# Patient Record
Sex: Male | Born: 1970 | Race: White | Hispanic: No | Marital: Married
Health system: Midwestern US, Academic
[De-identification: ages and names within clinical notes are randomized; demographics above are authoritative.]

---

## 2017-06-27 ENCOUNTER — Ambulatory Visit: Admit: 2017-06-27 | Discharge: 2017-06-28 | Primary: Neurology

## 2017-06-27 ENCOUNTER — Encounter: Admit: 2017-06-27 | Discharge: 2017-06-27 | Primary: Neurology

## 2017-06-27 DIAGNOSIS — F17200 Nicotine dependence, unspecified, uncomplicated: Principal | ICD-10-CM

## 2017-06-27 DIAGNOSIS — I639 Cerebral infarction, unspecified: ICD-10-CM

## 2017-06-27 NOTE — Progress Notes
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 Last seen 6 months ago at which time we discussed his ongoing recovery from aphasia.  No more SLP but working on language at home.  Had visual field testing and R hom homynopsia is much improved.  Has been driving with wife and doing well.  Some concerns about passing the DMV test based on reading ability.  Will be one year in September.  Will need repeat Neuropsychology testing in the fall.  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         Review of Systems   All other systems reviewed and are negative.      Current Outpatient Prescriptions:   ???  ascorbic acid (VITAMIN C) 500 mg tablet, Take 500 mg by mouth at bedtime daily., Disp: , Rfl:   ???  aspirin 81 mg chewable tablet, Chew 1 tablet by mouth daily. Take with food., Disp: 90 tablet, Rfl: 3  ???  sertraline (ZOLOFT) 25 mg tablet, TAKE 3 TABLETS BY MOUTH DAILY, Disp: 90 tablet, Rfl: 1  ???  simvastatin (ZOCOR) 10 mg tablet, Take 1 tablet by mouth at bedtime daily., Disp: 60 tablet, Rfl: 0  ???  vitamins, multiple tablet, Take 1 tablet by mouth at bedtime daily., Disp: , Rfl:      No Known Allergies     Physical Exam  Vitals:    06/27/17 1507   BP: 129/87   Pulse: 87   SpO2: 97%     Oswestry Total Score:: (P) 0  Pain Score: Zero  There is no height or weight on file to calculate BMI.    Neurologic Exam???  ???  Mental Status???  Speech: Expressive aphasia, nonfluent but much improved since last visit, able to comprehend complex commands and basic conversation, able to copy a sentence but cannot read it, cannot read his name but can tell me his name  Level of consciousness: alert  ??? Cranial Nerves???  Cranial nerves II through XII intact.   ???  Motor Exam???  Overall muscle tone: normal  ???  Strength   Strength 5/5 throughout.   ???  Sensory Exam???  Light touch normal.   ???  Gait, Coordination, and Reflexes???  ???  Gait  Gait: normal  ???  Coordination???  Heel to shin coordination: normal  ???  Reflexes   Right biceps: 3+  Left biceps: 3+  Right triceps: 3+  Left triceps: 3+  Right patellar: 3+  Left patellar: 3+  Right achilles: 3+  Left achilles: 3+  Right ankle clonus: absent  Left ankle clonus: absent         IMPRESSION:  1. Expressive aphasia    2. Acute ischemic left MCA stroke (HCC)    3. Impaired mobility and ADLs    4. 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 Therapies: completed outpatient SLP, continue HEP with friend who helps with letters, reading, and writing.    2. Cognition: Neuropsychology testing completed last year, will need to repeat this year about 1 year post-stroke (September).  3. Vision: Neuroopthalmology clinic established.  Visual fields improved with recent testing, will scan into records (small R quadrantopsia).  4. Driving: returned to driving  5. 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 Stroke resources:                 - gave information about aphasia research options at [facility] through the SLP department and at [employer] and [facility]   - gave information about local and national aphasia resources handout    RTC 3 mo to f/u on neuropscyh testing

## 2017-06-28 DIAGNOSIS — Z7409 Other reduced mobility: ICD-10-CM

## 2017-06-28 DIAGNOSIS — R480 Dyslexia and alexia: ICD-10-CM

## 2017-06-28 DIAGNOSIS — I63512 Cerebral infarction due to unspecified occlusion or stenosis of left middle cerebral artery: ICD-10-CM

## 2017-06-28 DIAGNOSIS — R4701 Aphasia: Principal | ICD-10-CM

## 2017-10-03 ENCOUNTER — Encounter: Admit: 2017-10-03 | Discharge: 2017-10-03 | Primary: Neurology

## 2017-10-03 ENCOUNTER — Ambulatory Visit: Admit: 2017-10-03 | Discharge: 2017-10-04 | Primary: Neurology

## 2017-10-03 DIAGNOSIS — I639 Cerebral infarction, unspecified: ICD-10-CM

## 2017-10-03 DIAGNOSIS — I69390 Apraxia following cerebral infarction: ICD-10-CM

## 2017-10-03 DIAGNOSIS — R4701 Aphasia: ICD-10-CM

## 2017-10-03 DIAGNOSIS — F17200 Nicotine dependence, unspecified, uncomplicated: Principal | ICD-10-CM

## 2017-10-03 NOTE — Progress Notes
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  Now one year post stroke in September.  Last seen 3 months ago at which time we discussed his ongoing recovery from aphasia and return to driving, activities.  Still working on language at home and very independent for physical activity including other people - driving, hauling, cutting down trees.  Had visual field testing and R hom homynopsia is much improved, has small quandrantopsia.  Has been driving with wife and doing well.  Will need repeat Neuropsychology testing in the fall, but have to reschedule based on recent hospitalization and cost.  Does report some dizziness that seems to come in spells a few days a week.  Not sure if lightheadedness or true vertigo.  Associated with a feeling of flushing and warmness, working outside and dehydration.  Has been told he has low blood pressure at recent hospitalization and other clinic visits.         Review of Systems   Respiratory: Positive for apnea.    Neurological: Positive for seizures and numbness (right leg-since the stroke).   All other systems reviewed and are negative.      Current Outpatient Prescriptions:   ???  ascorbic acid (VITAMIN C) 500 mg tablet, Take 500 mg by mouth at bedtime daily., Disp: , Rfl:   ???  aspirin 81 mg chewable tablet, Chew 1 tablet by mouth daily. Take with food., Disp: 90 tablet, Rfl: 3  ???  sertraline (ZOLOFT) 25 mg tablet, TAKE 3 TABLETS BY MOUTH DAILY, Disp: 90 tablet, Rfl: 1  ???  simvastatin (ZOCOR) 10 mg tablet, Take 1 tablet by mouth at bedtime daily., Disp: 60 tablet, Rfl: 0  ???  vitamins, multiple tablet, Take 1 tablet by mouth at bedtime daily., Disp: , Rfl:      No Known Allergies     Physical Exam  Vitals: 10/03/17 1404   BP: (!) 130/92   Pulse: 74   Resp: 16   Temp: 37.2 ???C (98.9 ???F)   TempSrc: Oral   SpO2: 98%   Weight: 109.8 kg (242 lb 1.9 oz)   Height: 198.1 cm (78)     Oswestry Total Score:: 0  Pain Score: Zero  Body mass index is 27.98 kg/m???.    Neurologic Exam???  ???  Mental Status???  Speech: Expressive aphasia, fluency much improved since last visit  Level of consciousness: alert  ???  Cranial Nerves???  Cranial nerves II through XII intact.  No nystagmus, abnormal saccads, not able to ellicit any vertigo   ???  Motor Exam???  Overall muscle tone: normal  ???  Strength   Strength 5/5 throughout.   ???  Sensory Exam???  Light touch normal.   ???  Gait, Coordination, and Reflexes???  ???  Gait  Gait: normal including tandem gait  ???  Coordination???  Heel to shin coordination: normal, negative Rhomberg test  ???  Reflexes   Right biceps: 3+  Left biceps: 3+  Right triceps: 3+  Left triceps: 3+  Right patellar: 3+  Left patellar: 3+  Right achilles: 3+  Left achilles: 3+  Right ankle clonus: absent  Left ankle clonus: absent         IMPRESSION:  1. Acute ischemic left MCA stroke (HCC)    2. Expressive aphasia  3. 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 Right homonymous hemianopsia          PLAN:    1. Therapies: completed outpatient SLP, continue HEP for speech fluency, reading, and writing. ???  2. Cognition: Neuropsychology testing completed last year, they would like to hold off on repeat for now  3. Vision: Neuroopthalmology clinic established.  Visual fields improved with recent testing.  4. Driving: returned to driving  5. 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 Stroke resources: ???gave information about aphasia research options at [facility] through the SLP department and at [facility] and [place], gave information about local and national aphasia resources handout  7. Post-stroke seizure: recommend that they establish care with a neurologist for recent seizure, they would prefer to get a referral from PCP nearer home  8. Dizziness/lightheadedness:  seems more consistent with pre-syncope or orthostasis rather than true vertigo, advised to keep hydrated, substitute gatorade or poweraid for water during strenuous activity, f/u PCP for blood pressure management.  ???  RTC prn future stroke rehabilitation needs

## 2017-10-04 DIAGNOSIS — I63512 Cerebral infarction due to unspecified occlusion or stenosis of left middle cerebral artery: Principal | ICD-10-CM

## 2023-01-30 ENCOUNTER — Encounter: Admit: 2023-01-30 | Discharge: 2023-01-30 | Primary: Neurology

## 2023-02-13 ENCOUNTER — Encounter: Admit: 2023-02-13 | Discharge: 2023-02-13 | Payer: MEDICARE | Primary: Neurology

## 2023-02-13 NOTE — Telephone Encounter
Patient contacted for pre-visit information gathering r/t referral for cognitive concerns and impaired gait (slumping to the right with ambulation).  This patient was hospitalized for stroke here at [facility] back in 2017 and his PCP wants him to be seen again to check on things and establish with neurolog  Referring provider: Jeanella Craze PA-C    Previous neurologist: [facility]     Imaging: CT Head 01/23/23        Recent lab work: wife reports trying to obtain this week        Recent ED visits (last 6 months) r/t chief complaint: No        Confirmed appointment for 02/18/23 at 10:15 am.  Also confirmed that patient aware of clinic location.

## 2023-02-18 ENCOUNTER — Ambulatory Visit: Admit: 2023-02-18 | Discharge: 2023-02-18 | Payer: MEDICARE | Primary: Neurology

## 2023-02-18 ENCOUNTER — Encounter: Admit: 2023-02-18 | Discharge: 2023-02-18 | Payer: MEDICARE | Primary: Neurology

## 2023-02-18 DIAGNOSIS — F17200 Nicotine dependence, unspecified, uncomplicated: Secondary | ICD-10-CM

## 2023-02-18 DIAGNOSIS — R519 Generalized headaches: Secondary | ICD-10-CM

## 2023-02-18 DIAGNOSIS — H547 Unspecified visual loss: Secondary | ICD-10-CM

## 2023-02-18 DIAGNOSIS — G4730 Sleep apnea, unspecified: Secondary | ICD-10-CM

## 2023-02-18 DIAGNOSIS — R413 Other amnesia: Secondary | ICD-10-CM

## 2023-02-18 DIAGNOSIS — F79 Unspecified intellectual disabilities: Secondary | ICD-10-CM

## 2023-02-18 DIAGNOSIS — R7309 Other abnormal glucose: Secondary | ICD-10-CM

## 2023-02-18 DIAGNOSIS — R569 Unspecified convulsions: Secondary | ICD-10-CM

## 2023-02-18 DIAGNOSIS — R4182 Altered mental status, unspecified: Secondary | ICD-10-CM

## 2023-02-18 DIAGNOSIS — I639 Cerebral infarction, unspecified: Secondary | ICD-10-CM

## 2023-02-18 DIAGNOSIS — G4733 Obstructive sleep apnea (adult) (pediatric): Secondary | ICD-10-CM

## 2023-02-18 DIAGNOSIS — I6340 Cerebral infarction due to embolism of unspecified cerebral artery: Secondary | ICD-10-CM

## 2023-02-18 DIAGNOSIS — R4189 Other symptoms and signs involving cognitive functions and awareness: Secondary | ICD-10-CM

## 2023-02-18 DIAGNOSIS — M1990 Unspecified osteoarthritis, unspecified site: Secondary | ICD-10-CM

## 2023-02-18 NOTE — Progress Notes
Date of Service: 02/18/2023    Subjective:             Gregory Gray is a 52 y.o. 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            History of Present Illness  HPI  Gregory Gray initially presented in 08/2016.  At that time he presented with sudden onset aphasia and R sided hemiparesis.  He was seen at OSH and had an NIH of 20 and was given tPA and transferred for possible EVT.   Upon arrival NIH 10.  CTA with mild to distal L M2/M3 occlusion with significant penumbra.  He went to IR, EVT was attempted but TICI 0.    Etiology of stroke remains cryptogenic.  HE had an TTE which showed EF of 65%, normal ventricular size and function, normal atrial size, no shunt or significant valvular abnormalities.  He underwent TEE which was unremarkable.  29 day event monitor was obtained but only showed a single baseline transmission on September 20th which demonstrated sinus rhythm, raising question if monitor was defective.   CT C/A/P was negative for malignancy.  Hypercoag workup was obtained.  Initial cardiolipin IgM was elevated at 18.1, but IgG was normal at 3.6.  Hex Lupus anticoagulant was elevated at 13.   DRVV was normal.  He had normal Beta 2 GP IgM/IgG, protein C/S, Factor V Leiden, activated protein C resistance, ESR, CRP.  A1c was a 5.6%, LDL at 85.  HE was started on ASA and statin.  He was discharged to IPR.  HE had followup in Neurology clinic in 12/2016 and LINQ device was ordered.   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  He was evaluated with neuropsych testing in 2017 and had scored 16/20 on simple items and 14/20 on complex items on boston diagnostic aphasia exam.  Further testing showed mild anxiety and no significant depressive symptomology.  HE was taking sertraline.  He had followup which showed improved scoring on his BDAE and CLOX showing improved recovery.  Further testing with WASI-II IQ score was 49m limited due to aphasia, perceptual reasoning was superior to verbal comprehension.  He had difficulties with vocabulary, verbal fluency, and naming.  Delayed memory was mildly impaired and visual motor speed was also mildly impaired.  IT was recommended he continue therapies.      Recent events:    Since his last visit in 2018 from a stroke standpoint he is overall doing well.   He as not had any further stroke like events.   He still has ongoing expressive speech difficulties and was in speech therapy but found that he was not advancing much more and has now stopped.  He was doing home exercises with home app but this has not helped much lately either.    He had a follow-up with his primary care doctor and has routine labs checked yearly.   He is still taking ASA 81mg , amlodipine 5mg , and crestor 5mg  for HLD.   SO far no etiology for his stroke has been found.  Family reports that he has significant family history of Factor V leiden and multiple family members have had blood clots/Pes.  His factor V leiden was negative.     Over the past few  months he has noticed worsening headaches.  This were worst 2-3 months ago, and recently have gotten better.  2-3 months ago he was having headaches daily, usually in the evenings until he went to bed.  Headaches were bitemporal and throbbing in nature.  He has photophobia and phonophobia.  No known vision changes, vomiting, or waking him up from sleep.  He reports his sleep is on and off.  He has OSA but does not always remember to wear his mask.  He does have periods of apnea and snoring per family.  He has not seen Sleep Medicine since his stroke in 2018.    Otherwise, he stays active working around the farm.  On hot days, he does get overheated and can get b/l UE cramps and stiffening.  He does not lose awareness or have abnormal shaking with these events. These events improve with rest and hydration.   He otherwise eats well and tries to stay hydrated.    Family has recently noticed more difficulties with his memory over the past year.  He is having more difficulty with both remote and recent memory.  Remote memory he can forget people he once knew or gets mixed up on childhood stories/events he once new well.  He also has difficulties with recent memory such as remembering important dates or appointments.  Sometimes he will forget to complete tasks such as doing dishes when he used to do them prior.  Denies getting lost in familiar environments but can get confused in unfamiliar environments.  NO concerns for leaving stove or water on.   Denies any hallucinations or changes in behavior, but family does notice he is more short tempered recently.   He was previously on fluoxetine for his stroke recovery and stroke related anxiety but has since stopped taking, he does not wish to resume taking at this time.     Due to memory difficulties and headache, repeat CT head was obtained in 01/2023 which showed stable LMCA stroke findings with encephalomalacia and ex vacuo dilation of lateral ventricle.   He was referred to [facility] for followup of these above issues.              Imaging/diagnostics:(Reviewed)  -9/ 2017: CTA head:   1.  Left distal M2/M3 arterial occlusion with associated hyperdense vessel   on noncontrast imaging.   2.  Paucity of distal arterial opacification in the left anterior lateral   frontal lobe without corresponding arterial occlusion identified.   3.  No obvious loss of gray-white differentiation or intracranial   hemorrhage.     -08/2016: CTA neck:   1.  Widely patent extracranial carotid and vertebral arteries.   2.  Ill-defined linear hypodensity in the proximal left internal carotid   artery favored for flow artifact. Carotid web is thought to be less   likely.     -08/2016: CT perfusion:   Small area of completed infarct within the posterior left MCA distribution   and small area in the left anterior medial frontal lobe deep white matter.   There are larger areas of parenchyma surrounding these infarcts with   decreased cerebral blood flow but preserved cerebral blood volume   suggesting tissue at risk.     -IR Arteriogram  1. Acute occlusion of mid left M2 segment involving a dominant superior   division of the MCA.   2. Unsuccessful mechanical thrombectomy. No recanalization was achieved.   (TICI 0)     -08/2016 MRI Head w/out  Interval expected evolution of moderate to large left MCA distribution   infarct with mild regional mass effect. No evidence of hemorrhagic   transformation or hydrocephalus.     -01/2023: CT Head /wout:  Unchanged prior L parietal MCA infarct with ex vacuo dilation of the posterior horn of L lateral ventricle.  No abnormal enhancement or ICH.  (Reviewed)    -08/2016 CT C/A/P  CHEST:   Normal CT of the chest.     ABDOMEN AND PELVIS:     1.  No abdominopelvic mass or lymphadenopathy.   2.  Right iliac fossa stranding likely from recent arteriogram. No   well-defined hematoma or fluid collection.       TTE 08/2016  Interpretation Summary    The left ventricle is normal in size and function, estimated ejection fraction is 65%.  The right ventricle is normal in size and function.  The left and right atria are normal in size. Agitated saline contrast injection with and without Valsalva demonstrates no evidence of right to left interatrial shunt.  No significant valve disease is identified.  Inadequate tricuspid regurgitation signal, unable to accurately estimate PA systolic pressure with this study.  No gross mass, thrombus or vegetation is identified.      No prior studies are available for comparison      TEE 08/2016  Interpretation Summary    Normal LV size, wall motion with EF =65%.   Normal RV size and function.   No evidence of intracardiac shunt with on color flow or agitated normal saline (bubble study)      2017:  Event monitor:  A looping event recorder was utilized for 29 days there is a single baseline transmission on September 20 which demonstrated sinus rhythm.  There are no additional transmissions.    2017: Diagnostic labs and hypercoag:  Initial cardiolipin IgM was elevated at 18.1, but IgG was normal at 3.6.  Hex Lupus anticoagulant was elevated at 13.   DRVV was normal.  He had normal Beta 2 GP IgM/IgG, protein C/S, Factor V Leiden, activated protein C resistance, ESR, CRP.  A1c was a 5.6%, LDL at 85.          Review of Systems   Respiratory:  Positive for apnea and cough.    Musculoskeletal:  Positive for arthralgias.   Allergic/Immunologic: Positive for environmental allergies.   Neurological:  Positive for seizures, speech difficulty and headaches.   Psychiatric/Behavioral:  Positive for agitation, confusion, decreased concentration and sleep disturbance.    All other systems reviewed and are negative.      Objective:          amLODIPine (NORVASC) 5 mg tablet Take one tablet by mouth daily.    ascorbic acid (VITAMIN C) 500 mg tablet Take one tablet by mouth at bedtime daily.    aspirin 81 mg chewable tablet Chew 1 tablet by mouth daily. Take with food.    rosuvastatin (CRESTOR) 5 mg tablet Take one tablet by mouth daily.    vitamins, multiple tablet Take one tablet by mouth at bedtime daily.     There were no vitals filed for this visit.  There is no height or weight on file to calculate BMI.     Physical Exam        Neuro exam:   Mental status: alert,  oriented to place and time, but some difficulty due to aphasia   Speech:    Normal Abnormal   Fluency  Expressive aphasia   Comprehension  Some difficulty with following complex commands    Articulation x                  Cranial Nerves:    Normal Abnormal   II PERRL, no visual fields deficits    III, IV, VI EOMI    V Sensation nml V1-V3    VII    Normal facial symmetry    VIII Intact to voice    IX, X Palate even    XI Equal shoulder shrug    XII Tongue midline Muscle/motor:   Tone: nml  Bulk: nml       NF NE SA EF EE WE WF FF FE FA TA HF HA HE KF KE DF PF In Ev TF TE   R   5 5 5 5 5 5    5   5 5 5 5        L   5 5 5 5 5 5    5   5 5 5 5            Sensation:    Normal RUE LUE RLE LLE   Light Touch x       Pin Prick        Temperature        Vibration        Proprioception            Coordination:    Normal Abnormal Right Abnormal Left   Finger to Nose x     Rapid alternating       Heel to Shin      Finger tap      Foot tap      Other        Gait and Station:  Regular gait: nml            Assessment and Plan:  Gregory Gray is a 52 y.o. male h/o LMCA stroke s/p tPA and EVT TICI 0 in 08/2016 of undetermined etiology,  OSA, HLD, smoking  that presents for follow up of his stroke and for memory difficulties and headaches.       From a stroke standpoint he is overall stable.  Reviewed last CT in 01/2023, shows stable chronic changes since last MRI with encephalomalacia and ex-vacuo dilation of the posterior L lateral ventricle.  No new findings.    Overall etiology of his stroke is unknown, and remains cryptogenic at this time.   He did have an event monitor, but was faulty as monitor only transmitted one recording although he wore it for the month.  Will plan to repeat.  Also on review of his prior labs, he had borderline elevated Cardiolipin IgM and Hex lupus, will plan to repeat hypercoags today.  He also has strong family history of clots/Pes and family history of factor V leiden, although testing was negative for him.   Will send to Hematology also for evaluation given cryptogenic stroke and family history.   Otherwise continue secondary stroke risk factor modifications as below with ASA, statin, HTN management.     Episodes of UE Cramping:  Semiology of events with retained awareness is not consistent with epileptic seizures.  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       Cognitive dysfunction:  1 year history of worsening remote and recent memory per family.  MOCA scoring today  is 19/30, but limited due to aphasia.   Suspect cognitive decline likely related to sequale of stroke.  Will send additional testing B12/Folate, RPR, TSH to assess for reversible causes.  He has had neuro psych testing after his stroke as above, but has not followed up since about 2018.   Referral to Memory Clinic for formal testing.      Headaches: Suspect migraine without aura.   Improved recently over past few months.  Will plan to repeat MRI to ensure no other causes, although suspicion is low.   Has OSA which may be contributing due to reported apnea/snoring and he does forget to wear his CPAP at night.  Since it has been a number of years since his last eval with Sleep Medicine, will refer back for evaluation.             LMCA stroke s/p tPA and EVT TICI 08/2016, etiology, cryptogenic  -Continue ASA 81mg  daily  -Prior TTE/TEE unremarkable.   EF of 65%, normal ventricular size and function, normal atrial size, no shunt or significant valvular abnormalities.    -Had prior event monitor, but only single recording was sent, concerning for faulty monitor.  Will repeat 30 day MCOT.   -HLD: Goal LDL <70.  Continue crestor 5mg .  Repeat lipid panel.   -A1c goal <7.0.  Repeat A1c  -HTN: BP today 134/88.  Goal BP <130/80.  Continue amlodipine 5mg  daily.    -Borderline hypercoag testing:  2017:  Initial cardiolipin IgM was elevated at 18.1, but IgG was normal at 3.6.  Hex Lupus anticoagulant was elevated at 13.   DRVV was normal.  He had normal Beta 2 GP IgM/IgG, protein C/S, Factor V Leiden, activated protein C resistance, ESR, CRP.   Will plan to repeat Cardiolipin, Hex Lupus AC, Beta 2 GP, DRVV today.   Will also send referral to Hematology given strong family history of clots/PES (has Factor V leiden, but he was negative).    Cognitive dysfunction  -MOCA scoring today, mostly limited due to aphasia score 19/30  -B12/Folate, RPR, TSH reflex T4  -Referral to Memory Clinic     Headaches, Possible Migraine w/out aura  -Start Magnesium Oxide 400mg  daily.  Does not wish to do other pharmacological meds at this time.   -Will also repeat MRI and refer to Sleep Medicine to see if sleep apnea could be contributing  -Lifestyle modifications discussed    Episodes of UE Cramping  -Semiology of events with retained awareness is not consistent with epileptic seizures.  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    Cherlynn Kaiser, DO  Vascular Neurology       Time Spent 78 minutes.

## 2023-02-18 NOTE — Patient Instructions
For your headaches we will do a repeat MRI  We recommend to start Magnesium Oxide 400mg  daily this can be found over the counter   We will refer you to Sleep Medicine for follow up of your sleep    We will plan to repeat an heart monitor today and repeat labs for your stroke and memory  We will refer you for memory testing at our [facility]

## 2023-02-20 ENCOUNTER — Encounter: Admit: 2023-02-20 | Discharge: 2023-02-20 | Payer: MEDICARE | Primary: Neurology

## 2023-02-20 ENCOUNTER — Ambulatory Visit: Admit: 2023-02-20 | Discharge: 2023-02-20 | Payer: MEDICARE | Primary: Neurology

## 2023-02-20 DIAGNOSIS — I6340 Cerebral infarction due to embolism of unspecified cerebral artery: Secondary | ICD-10-CM

## 2023-02-20 NOTE — Progress Notes
To our valued patient,     We have enrolled your heart monitor and requested it to be mailed to your home.  You should receive this within 2-3 business days. Please wear the monitor for 30 days. When you have completed the study, please remove the device, and mail it back to the company. Please call Customer Service at [phone] (option 1, 1, 1) with questions about placement, troubleshooting, and insurance coverage. You can reach the ambulatory heart monitor team at [phone].        Please write your NAME, PHYSICIAN, START DATE/TIME on the diary.    Prep skin by shaving and ensuring there is no lotion on the chest.  Gently abrade for clear ecgs.  Showering is okay, but best to shower with back to the water.    Please use the diary for symptoms - specific date and times and what you are feeling.  Please return the device in the mailer with diary promptly after completing the study.        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)  Ordering Provider: [name], DO  AMB Monitor Serial Number: home  No data recorded    Start Time and Date: 02/20/23 8:26 AM   Patient Name: Gregory Gray  DOB: 06/02/1971 09/12/70  MRN: [id number]  Sex: male  Mobile Phone Number: [phone] (mobile)  Home Phone Number: [phone]  Patient Address: [phone] [name] [phone]  Insurance Coverage: Francine Graven CHOICE PPO  Insurance ID: [id number]  Insurance Group #: [id number]  Insurance Subscriber: Basich,Zohar  Implanted Cardiac Device Information: No results found for: EPDEVTYP      Patient instructed to contact company phone number on the monitor box with questions regarding billing, placement, troubleshooting.     Dorena Dew    ____________________________________________________________    Clinic Staff:    Complete additional steps for documentation double check/Co-Sign.  In Follow-up, send chart upon closing encounter to [facility] HRM AMBULATORY MONITORS    HRM Ambulatory Monitoring Team:  Schedule on appropriate template and check-in.   Clinic Placement Schedule on clinic location [facility] schedule   Home Enrollment Schedule on Home Enrollment schedule ([facility] RHYTHM)   Given to patient in clinic for self-placement Schedule on Home Enrollment schedule ([facility] RHYTHM)   Inpatient Schedule on [place] [facility] AMBULATORY MONITORING template   2. Please enroll with appropriate vendor.

## 2023-02-24 ENCOUNTER — Encounter: Admit: 2023-02-24 | Discharge: 2023-02-24 | Payer: MEDICARE | Primary: Neurology

## 2023-03-06 ENCOUNTER — Encounter: Admit: 2023-03-06 | Discharge: 2023-03-06 | Payer: MEDICARE | Primary: Neurology

## 2023-03-08 ENCOUNTER — Encounter: Admit: 2023-03-08 | Discharge: 2023-03-08 | Payer: MEDICARE | Primary: Neurology

## 2023-03-11 ENCOUNTER — Encounter: Admit: 2023-03-11 | Discharge: 2023-03-11 | Payer: MEDICARE | Primary: Neurology

## 2023-03-11 DIAGNOSIS — I639 Cerebral infarction, unspecified: Secondary | ICD-10-CM

## 2023-03-11 NOTE — Telephone Encounter
Encounter for order for MRI

## 2023-04-14 ENCOUNTER — Encounter: Admit: 2023-04-14 | Discharge: 2023-04-14 | Payer: MEDICARE | Primary: Neurology

## 2023-04-14 DIAGNOSIS — I639 Cerebral infarction, unspecified: Secondary | ICD-10-CM

## 2023-04-14 NOTE — Telephone Encounter
Called to report results of event monitor.  Event monitor was unremarkable.  Wife reports that lab does not have orders, will place new orders as previously recommended in clinic.     Cherlynn Kaiser, DO  Vascular Neurology       Event Monitor    Result Text  30-day event monitor reviewed for history of CVA     1.  Baseline sinus rhythm with average heart rate of 78 bpm.  Range between 51 to 147 bpm  2.  No evidence of atrial fibrillation noted  3.  Patient symptoms of lightheadedness tiredness and fatigue do not correlate with any arrhythmias.  4.  No other evidence of any arrhythmias noted        Overall benign event monitor

## 2023-04-23 ENCOUNTER — Ambulatory Visit: Admit: 2023-04-23 | Discharge: 2023-04-23 | Payer: MEDICARE | Primary: Neurology

## 2023-04-23 ENCOUNTER — Encounter: Admit: 2023-04-23 | Discharge: 2023-04-23 | Payer: MEDICARE | Primary: Neurology

## 2023-04-23 DIAGNOSIS — I639 Cerebral infarction, unspecified: Secondary | ICD-10-CM

## 2023-04-23 MED ORDER — GADOBENATE DIMEGLUMINE 529 MG/ML (0.1MMOL/0.2ML) IV SOLN
20 mL | Freq: Once | INTRAVENOUS | 0 refills | Status: CP
Start: 2023-04-23 — End: ?
  Administered 2023-04-23: 13:00:00 20 mL via INTRAVENOUS

## 2023-04-24 ENCOUNTER — Encounter: Admit: 2023-04-24 | Discharge: 2023-04-24 | Payer: MEDICARE | Primary: Neurology

## 2023-04-24 NOTE — Telephone Encounter
Called and relayed results of MRI to wife Marylene Land.   MRI shows old known stroke.  MRI also shows 1cm small ovoid enhancing  extraaxial lesion, most likely a meningioma, less likely aneurysm.  Discussed further imaging with CTA/MRA and follow-up imaging to ensure stability with repeat MRI in ~6-12 months.    Marylene Land will talk with Willi and will wait to next appointment to discuss results and timing of repeating imaging.       Cherlynn Kaiser, DO  Vascular Neurology

## 2023-05-18 ENCOUNTER — Encounter: Admit: 2023-05-18 | Discharge: 2023-05-18 | Payer: MEDICARE | Primary: Neurology

## 2023-05-18 NOTE — Progress Notes
PRE-VISIT PLANNING    Appointment Date/Time: June 17th at 9:20 am  Provider: Lenice Llamas ARPN-NP      PCP Clinical Notes: Yes    Referral Clinical Notes: Yes    Where were they referred from? Dr. Gaylyn Lambert    Called patient/caregiver?         Patient's primary language? English     Do they have another neurologist, if so who?     Additional clinical notes requested? No      Imaging: (CT head, MRI head/brain, FDG-PET within the past 5 years): Yes    What images have they had? CT  head and MRI Head    Location and Dates of imaging: 01/23/2023 and 04/23/2023    Images requested? No          Dates requested:     Reports requested? No        Dates requested:     Images and reports received? Yes All imaging and reports are in O2      Recent Labs:    TSH   No    Vit B12  No    Lab results requested:  No THERE ARE ORDERS IN O2, JUST NOT DONE      Neuropsych/Cognitive testing: Yes    If yes, where was testing done?     Reports requested? No IN O2 FROM 2017    Reports received? Yes    Date Neuropsychology Testing completed: 2017 WITH DR. MONICA KURYLO        Notes: All records are in O2, if there is something not in the chart I will request at the time of office visit.    Adella Hare MA

## 2023-05-19 NOTE — Progress Notes
MEMORY CARE CLINIC EVALUATION    Referring Provider:    Emi Belfast, MD  [address]  [place],  [place] [address]      Chief Complaint:  Gregory Gray is a 52 y.o. old male here for a cognitive evaluation.   Onset:  2022  Course: slowly progressive    This patient is accompanied in the office by his wife, Gregory Gray who contributes to the history.   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 Struggles to read and write after his stroke.     His wife has noticed within the last 2 years he has been having more difficulty with his memory. He is repeating questions and statements. He is forgetting recent conversations. He will talk with his wife and come back 10 min later and talk about the same thing and not realize they already had the conversation. He is driving less, only staying in familiar areas. He used to go grocery shopping, doesn't feel comfortable going to get items. Prefers to keep a routine, will get upset if routine changes.    Social Hx:  Denies history of alcohol or illicit drug abuse.    Relevant Family Hx: Grandfather with dementia     Functional History:  Living situation: Lives with wife  Eating: Independent  Toileting: Independent  Bathing: Independent  Dressing: Independent  Meal Preparation: Independent, can make a simple meal. Wife is the main cook  Usual household chores: Independent, Field seismologist  Medication administration: Dependent, wife sets them up in a pillbox  Managing finances: Dependent, wife took over after his stroke. He would struggle to calculate a tip or make change  Activity: Gardens, woodworking     ROS  Behavioral and Neuropsychiatric History:   PHQ-2 Score: 0 (05/22/2023  9:31 AM)    Depression: no  Anxiety: no  Agitation/Behavior/Personality changes: Increased agitation and frustration    Tremors: no    Gait Changes/Falls: R leg weakness from previous stroke, stumbles occasionally, more stooped posture    Vision/Hearing  problems: no  Speech changes: expressive aphasia    Wt loss: no  Swallowing difficulties: no    Sleep issues: Difficulty with sleep initiation or maintenance. Prefers to keep tv on when sleeping. Has sleep apnea, occasionally wears CPAP. No known RBD  B/B Incontinence: no    Fluctuations in cognition (throughout the day): Yes   Excessive daytime sleepiness: Yes   Loss of smell: No   Repeated visual hallucinations: No   REM sleep behavior disorder: No   Parkinsonism: No   Orthostatic instability: No   Constipation: No   Rhinorrhea:No     Behavioral disinhibition: Yes  - loss of filter when out in public  Socially inappropriate behavior No  Loss of manners or decorum No  Impulsive, rash, or careless actions No  Early apathy or inertia: No  Early Loss of sympathy/empathy: No  Early perseverative, stereotyped, or compulsive/ritualistic behavior: No  Hyperorality and dietary changes: No  Neuropsychological profile - executive dysfunction with relative sparing of memory and visuospatial functions: No    PMH  Vascular disease (CVA, CAD): Yes   Head trauma/TBI: No   Delirium (illness/hospitalizations): No  Vitamin B12 deficiency: No  Cancer: No  Did you receive chemotherapy? No  Major surgeries since memory changes began? No  Thyroid disease: No  Seizures: Yes - seizure in 2018 on a hot day, no seizure since  OSA: Yes wears CPAP    Past Medical History:   Diagnosis Date    Altered mental status 2017    Stroke    Arthritis 2022    Normal wear and tear on the body    Disorganized thinking 2017    Stroke    Generalized headaches 2017    After stroke    Intellectual disability 2017    Stroke    Memory loss 2023    Spouse noticed, patient denies    Seizures (HCC) 2018    After stroke    Sleep apnea 2010    Sleep study    Smoker     Stroke [facility])     Vision decreased 2017 Normal progression       History reviewed. No pertinent surgical history.    Social History     Socioeconomic History    Marital status: Married   Tobacco Use    Smoking status: Every Day     Types: Cigars     Passive exposure: Past (Wife)    Smokeless tobacco: Never    Tobacco comments:     Quit after stroke 2018,  started again in 2022 and current 05-22-23.   Substance and Sexual Activity    Alcohol use: Yes     Comment: Occasionally a beer 05-22-23.    Drug use: Never    Sexual activity: Yes     Partners: Female     Birth control/protection: None       Care Partner Burden and Social Support:  Gregory Gray does partially rely on care giving.  Care partner burden expressed? Yes    Safety Assessment:  Driving Yes - staying in more familiar areas and routine     Disorientation when driving No     Recent traffic violations, accidents, vehicle damage No     Passenger concerns No  Safety concerns in home (fall risk, wandering etc.) No  Accessibility to firearms Yes - locked    Advanced Care Planning  DPOA:  Yes   Advance Directive? Yes    Medications   amLODIPine (NORVASC) 5 mg tablet Take one tablet by mouth daily.    ascorbic acid (VITAMIN C) 500 mg tablet Take one tablet by mouth at bedtime daily.    aspirin 81 mg chewable tablet Chew 1 tablet by mouth daily. Take with food.    rosuvastatin (CRESTOR) 5 mg tablet Take one tablet by mouth daily.    vitamins, multiple tablet Take one tablet by mouth at bedtime daily.       Medication Review:   I have personally reviewed the medication list and am making recommendations as listed in the plan below.   Wife Gregory Gray manages medication at home for Gregory Gray.   The following medications have been identified as potential high risk medications: none      Physical/Cognitive Exam  BP (!) 142/85 (BP Source: Arm, Left Upper, Patient Position: Sitting)  - Pulse 74  - Temp 36.6 ?C (97.8 ?F) (Oral)  - Resp 16  - Ht 198.1 cm (6' 6)  - Wt 99.8 kg (220 lb)  - SpO2 96%  - BMI 25.42 kg/m?   BP Readings from Last 3 Encounters:   05/22/23 (!) 142/85   02/18/23 134/88   10/03/17 (!) 130/92     Wt Readings from Last 6 Encounters:   05/22/23 99.8 kg (220 lb)   02/18/23 101.1 kg (222 lb 12.8 oz)   10/03/17 109.8 kg (242 lb  1.9 oz)   12/27/16 106.1 kg (234 lb)   12/27/16 106.1 kg (234 lb)   10/18/16 102 kg (224 lb 13.9 oz)       General  Groomed with appropriate hygiene. Dress appropriate for season/situation. No assist device    HEENT  NC/AT    Mental Status  Alert  Language fluent    Pulm/CV  Non labored, good effort    Cranial Nerves  Extraocular movements intact  Pupils equal and reactive to light  No facial asymmetry  Tongue midline  All other CNs normal    Motor  Full strength in the upper and lower extremities  Rigidity: None  Bradykinesia: None  Tremor: None  Romberg:    Reflexes  Symmetric, no evidence of hyperreflexia in the biceps, brachioradialis and knees     Coordination  Finger to nose testing without dysmetria bil  Rapid alternating movements normal bil  Toe Tap normal bil    Gait  Rising from chair: Normal - without pushing off  Posture: Normal  Gait initiation: Normal  Stride Length: Normal  Arm Swing: Normal  Turns: Normal    Psych  Affect and mood congruent and appropriate to situation  Thought process is logical  Anosognosia No     Neurobehavioral Testing:  02/2023 [facility] 19/30  05/22/2023 [facility]: 20/38     Neuropsychology Summary and Impression (refer to Neuropsychologist progress note for complete documentation of tests performed and results):  11/2016 Neuropsych Evaluation Dr. Rudi Heap  Impressions:  This neuropsychological evaluation revealed that the patient continues to have expressive and receptive aphasia that are impacting his performance on most auditory tasks.  For example, his estimated overall intellectual functioning was in the borderline range on the WASI-II and, in comparison, is average on a non-verbal intelligence test (TONI-4).  In addition, he had significant difficulties on tasks of vocabulary, verbal fluency, and naming, while his vocabulary on a picture-vocabulary test that allowed him to point to the picture best fitting a word was average and above the 12th grade equivalent.  His aphasia also impacted his performance on verbal memory tasks and a verbal reasoning task (all within the impaired range).  However, his visual-spatial abilities and visual reasoning were average.  His immediate simple visual memory was average but delayed was mildly impaired.  Similarly his immediate complex visual memory was low average but delayed was mildly impaired.  On the latter task, his performance improved to within the average range with cuing.  His performance on visual motor speed tasks was impaired likely in part because of difficulties with letters and numbers (Trail Making), although his visual motor speed was also slowed on a fine motor dexterity task (severely impaired bilaterally).  His gross motor strength was low average bilaterally.  His basic sensory perceptual functioning was intact with compensation noted in his right visual field on tasks.  He endorsed minimal depressed mood and anxiety symptoms on screens.     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 Sarajane Marek  Test Results: Patient performed below expectations on a cognitive screening measure. On a word learning and memory measure, patient performed below expectations on learning with poor delayed recall. He minimally benefited from a recognition format. Patient performed below expectations on measures of basic attention, working memory, processing speed, phonemic fluency, and set-switching. He performed within expectations on measures of semantic fluency and confrontation naming.  Summary and Impression: Patient displayed deficits in memory (encoding and retention), executive functioning, processing speed, and attention. He otherwise performed within expectations.      Patient meets criteria for mild dementia. His performance may reflect medial temporal lobe dysfunction and frontal/subcortical/parietal lobe dysfunction. He displayed some expressive language dysfunction which may have impacted his performance, though his language was largely intact on objective measures. A possible etiology is his history of stroke. However, it is unclear if his stroke fully explains his performance. An emerging neurodegenerative condition cannot be ruled out, specifically Alzheimer's disease, given his wife's report of progressive cognitive decline.     Labs/Imaging  TSH   Date Value Ref Range Status   08/12/2016 0.216 (L) 0.35 - 5.00 MCU/ML Final     No results found for: VITB12    01/23/2023 CT Head  Unchanged prior L parietal MCA infarct with ex vacuo dilation of the posterior horn of L lateral ventricle. No abnormal enhancement or ICH       04/23/2023 MRI Head  IMPRESSION     1. Chronic left MCA distribution infarct/s   2. No acute/recent infarct   3. Mild chronic microvascular ischemic changes.   4. Small ovoid 1 cm extra-axial enhancing lesion at the anterior left   middle cranial fossa. The leading diagnostic consideration is a small   meningioma. Potential other possibilities include a varix or less likely   an atypical aneurysm. If clinically indicated, CTA or MRA is recommended   for further evaluation          Diagnosis and Plan  1. Acute ischemic left MCA stroke (HCC)        2. Expressive aphasia        3. OSA on CPAP        4. Cognitive change            FAST Score:  4. Mild Dementia-IADL's become affected, such as bill paying, cooking, cleaning, traveling    Decision Making  Patient does not appear to have remarkable difficulties with their medical decision-making capacity.    CARE PLAN  Further Evaluation  We can proceed with spinal fluid Alzheimer's disease biomarker testing via lumbar puncture.     Risks:  This test is generally well tolerated but can come with a self limiting headache in 10-20% of patients.    Spinal fluid that we remove to send off for testing is remade within 6-12 hours so any headache is generally resolved at that point.   Headaches tend to be positional and improve with lying down. We recommend drinking plenty of fluids and taking it easy for the day.   If these headaches are not resolving within 3 days, then it could indicate a CSF leak problem - as in the lumbar puncture site never fully clotted off.   This happens in less than 3% of the 10-20% that ever get a headache.   It can be treated with a blood patch through interventional radiology.    Benefits:  We will be able to determine with fairly high likelihood whether Alzheimer's disease is playing a role in cognition or not.   Complete Vitamin B12 and TSH that was ordered in May    Cognitive therapy plan  Deferred at this time. We can start Donepezil (Aricept) that helps to slow down memory changes  Risks and benefits of sleep apnea discussed.  Obstructive sleep apnea symptoms are snoring, increased breathing effort while asleep, and drops in oxygen while asleep. This causes  harm to the body in multiple ways:  Decreased time in restorative sleep cycle which lead to daytime sleepiness, morning headaches, decreased concentration, anxiety , depression, traffic accidents, and poor quality of life  The repeated drop in oxygen levels triggers the release of stress hormones in the body that leads to increased levels of inflammation throughout the body. This inflammation can cause cognitive decline, high blood pressure, heart disease, insulin resistance, and fat accumulation in the liver which can lead to liver failure.  Treating sleep apnea appropriately can often improve or stabilize all of these things. We recommend continuing to wear your CPAP at night.    Neuropsychiatric therapy  No medications needed at this time. If frustration, irritability, anxiety impact your everyday, we would recommend starting a medication such as Lexapro     Lifestyle Recommendations  Encouraged to stay active mentally, physically, and socially  Encouraged heart healthy diet  Driving: Continue with current driving limitations.   Stay in your familiar areas, avoid highways and rush hour traffic, keep driving only during the daytime.  Advised to limit alcohol intake to 1 standard alcoholic beverage/day or less.  My Alliance: [email or web address]    Referrals  [facility]:  Deferred today, has adequate support   Research:  Discussed, declined today      Follow-up  Return to clinic for follow up visit in 6 months      Other important information:  Scheduling: [phone]   Lenice Llamas or our nurses: [phone]      Total of 80 minutes were spent on the same day of the visit including preparing to see the patient, obtaining and/or reviewing separately obtained history, performing a medically appropriate examination and/or evaluation, counseling and educating the patient/family/caregiver, ordering medications, tests, or procedures, referring and communication with other health care professionals, documenting clinical information in the electronic or other health record, independently interpreting results and communicating results to the patient/family/caregiver, and care coordination.

## 2023-05-22 ENCOUNTER — Encounter: Admit: 2023-05-22 | Discharge: 2023-05-22 | Payer: MEDICARE | Primary: Neurology

## 2023-05-22 DIAGNOSIS — R4189 Other symptoms and signs involving cognitive functions and awareness: Secondary | ICD-10-CM

## 2023-05-22 DIAGNOSIS — R569 Unspecified convulsions: Secondary | ICD-10-CM

## 2023-05-22 DIAGNOSIS — G4733 Obstructive sleep apnea (adult) (pediatric): Secondary | ICD-10-CM

## 2023-05-22 DIAGNOSIS — R519 Generalized headaches: Secondary | ICD-10-CM

## 2023-05-22 DIAGNOSIS — G4730 Sleep apnea, unspecified: Secondary | ICD-10-CM

## 2023-05-22 DIAGNOSIS — F17200 Nicotine dependence, unspecified, uncomplicated: Secondary | ICD-10-CM

## 2023-05-22 DIAGNOSIS — F79 Unspecified intellectual disabilities: Secondary | ICD-10-CM

## 2023-05-22 DIAGNOSIS — R4182 Altered mental status, unspecified: Secondary | ICD-10-CM

## 2023-05-22 DIAGNOSIS — R4701 Aphasia: Secondary | ICD-10-CM

## 2023-05-22 DIAGNOSIS — H547 Unspecified visual loss: Secondary | ICD-10-CM

## 2023-05-22 DIAGNOSIS — I63512 Cerebral infarction due to unspecified occlusion or stenosis of left middle cerebral artery: Secondary | ICD-10-CM

## 2023-05-22 DIAGNOSIS — M1990 Unspecified osteoarthritis, unspecified site: Secondary | ICD-10-CM

## 2023-05-22 DIAGNOSIS — I639 Cerebral infarction, unspecified: Secondary | ICD-10-CM

## 2023-05-22 DIAGNOSIS — R413 Other amnesia: Secondary | ICD-10-CM

## 2023-05-22 NOTE — Progress Notes
Total psychometrist face to face time with patient (testing and scoring) was 33min.

## 2023-05-22 NOTE — Progress Notes
[facility]  Neuropsychology Evaluation Note    05/22/2023    Gregory Gray  [id number]  05/09/1971 (51 y.o.)     Referring Provider: Emi Belfast, MD    Chief Complaint: Memory Loss (New patient)    Reason for Referral: The patient is a 53 y.o. right-handed male referred for neuropsychological evaluation due to cognitive decline. The following information was obtained from the clinical interview with the patient and review of the pertinent and available medical records. Please refer to physician assistant/nurse practitioner note for collateral information (if available) and additional history.    Patient's Presenting Concerns  Patient reported that wife has concerns about his cognition.     Collateral Report (obtained by physician assistant/nurse practitioner): See concurrent note from this encounter for collateral report if available.    Relevant History:  Past Medical History:   Diagnosis Date    Altered mental status 2017    Stroke    Arthritis 2022    Normal wear and tear on the body    Disorganized thinking 2017    Stroke    Generalized headaches 2017    After stroke    Intellectual disability 2017    Stroke    Memory loss 2023    Spouse noticed, patient denies    Seizures (HCC) 2018    After stroke    Sleep apnea 2010    Sleep study    Smoker     Stroke (HCC)     Vision decreased 2017    Normal progression     Medications:   amLODIPine (NORVASC) 5 mg tablet Take one tablet by mouth daily.    ascorbic acid (VITAMIN C) 500 mg tablet Take one tablet by mouth at bedtime daily.    aspirin 81 mg chewable tablet Chew 1 tablet by mouth daily. Take with food.    rosuvastatin (CRESTOR) 5 mg tablet Take one tablet by mouth daily.    vitamins, multiple tablet Take one tablet by mouth at bedtime daily.     Social History:   Education: 2 years college.   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      Prior Studies:    Neuroimaging:  04/23/2023 Brain MRI:   IMPRESSION   1. Chronic left MCA distribution infarct/s   2. No acute/recent infarct   3. Mild chronic microvascular ischemic changes.   4. Small ovoid 1 cm extra-axial enhancing lesion at the anterior left   middle cranial fossa. The leading diagnostic consideration is a small   meningioma. Potential other possibilities include a varix or less likely   an atypical aneurysm. If clinically indicated, CTA or MRA is recommended   for further evaluation     Behavioral observations: Patient was pleasant and cooperative. Vision and hearing appeared adequate for testing. Speech was notable for some word finding issues and circumstantial speech. Comprehension was intact. Thought processes were linear and goal-directed. Mood was euthymic with congruent affect. Patient was appropriate in their social interactions. Patient appeared attentive and engaged.     Test Results: Patient performed below expectations on a cognitive screening measure. On a word learning and memory measure, patient performed below expectations on learning with poor delayed recall. He minimally benefited from a recognition format. Patient performed below expectations on measures of basic attention, working memory, processing speed, phonemic fluency, and set-switching. He performed within expectations on measures of semantic fluency and confrontation naming.     Summary and Impression: Patient displayed deficits in memory (encoding and retention),  executive functioning, processing speed, and attention. He otherwise performed within expectations.     Patient meets criteria for mild dementia. His performance may reflect medial temporal lobe dysfunction and frontal/subcortical/parietal lobe dysfunction. He displayed some expressive language dysfunction which may have impacted his performance, though his language was largely intact on objective measures. A possible etiology is his history of stroke. However, it is unclear if his stroke fully explains his performance. An emerging neurodegenerative condition cannot be ruled out, specifically Alzheimer's disease, given his wife's report of progressive cognitive decline.     Recommendations:  Follow-up.  Providers in the [facility] will provide feedback of the current test results and determine if follow-up is indicated.     Danne Baxter, Ph.D., ABPP  Board Certified in Clinical Neuropsychology  Department of Neurology  [facility]    Time Documentation: Total time for this evaluation included 33 minutes of neuropsychological test administration and scoring by technician, two or more tests, any method; 3 minutes of neuropsychologist time in interview/neurobehavioral status examination (below threshold for billing), and 34 minutes of neuropsychological testing evaluation services by neuropsychologist time including integration patient data, interpretation of standardized test results and clinical data, clinical decision-making, treatment planning and report.     Neuropsychological Test Data:   Raw T-score Percentile   Short Test of Mental Status (STMS) 20/38        Orientation 5/8        Attention 3/7        Immediate Recall (# of trials: 4) 4/4        Calculations 0/4        Abstraction 3/3        Visuospatial/Construction (2/2 cube; 1/2 clock) 3/4        Information 1/4        Delayed Recall 4/4           Processing Speed/Attention      Trail Making Test (TMT): Part A 50  (0 errors) 33 4   Digit Span: Longest Span Forward 4 24 < 1   Digit Span: Longest Span Backward 3 27 1          Learning and Memory      HVLT-R: Total Recall (Sum of Trials 1-3) 19 34 5   HVLT-R: Delayed Recall 2 24 < 1   HVLT-R: Retention (%) 25 27 1    HVLT-R: Recognition Disc. (Hits - False Positives) 8 33 4         Language      CIFA Category Fluency: Animals 17 42 21   [place] Naming Test (BNT-30) 30 55 69         Executive Functions      Trail Making Test (TMT): Part B D/C  on sample --- ---   CIFA Letter Fluency: S Words 3 21 < 1     Note: Scores provided for professional use. For interpretation, see test results/impression sections. The [employer] System ([place], [place], & [place], 2010) scores were calibrated for age, sex, race, & education unless otherwise noted. Scores from non-CNNS tests are not necessarily calibrated for the same variables.

## 2023-05-22 NOTE — Patient Instructions
CARE PLAN  Further Evaluation  We can proceed with spinal fluid Alzheimer's disease biomarker testing via lumbar puncture.     Risks:  This test is generally well tolerated but can come with a self limiting headache in 10-20% of patients.    Spinal fluid that we remove to send off for testing is remade within 6-12 hours so any headache is generally resolved at that point.   Headaches tend to be positional and improve with lying down. We recommend drinking plenty of fluids and taking it easy for the day.   If these headaches are not resolving within 3 days, then it could indicate a CSF leak problem - as in the lumbar puncture site never fully clotted off.   This happens in less than 3% of the 10-20% that ever get a headache.   It can be treated with a blood patch through interventional radiology.    Benefits:  We will be able to determine with fairly high likelihood whether Alzheimer's disease is playing a role in cognition or not.   Complete Vitamin B12 and TSH that was ordered in May    Cognitive therapy plan  Deferred at this time. We can start Donepezil (Aricept) that helps to slow down memory changes  Risks and benefits of sleep apnea discussed.  Obstructive sleep apnea symptoms are are snoring, increased breathing effort while asleep, and drops in oxygen while asleep. This causes harm to the body in multiple ways:  Decreased time in restorative sleep cycle which lead to daytime sleepiness, morning headaches, decreased concentration, anxiety , depression, traffic accidents, and poor quality of life  The repeated drop in oxygen levels triggers the release of stress hormones in the body that leads to increased levels of inflammation throughout the body. This inflammation can cause cognitive decline, high blood pressure, heart disease, insulin resistance, and fat accumulation in the liver which can lead to liver failure.  Treating sleep apnea appropriately can often improve or stabilize all of these things. We recommend continuing to wear your CPAP at night.    Neuropsychiatric therapy  No medications needed at this time. If frustration, irritability, anxiety impact your everyday, we would recommend     Lifestyle Recommendations  Encouraged to stay active mentally, physically, and socially  Encouraged heart healthy diet  Driving: Continue with current driving limitations.   Stay in your familiar areas, avoid highways and rush hour traffic, keep driving only during the daytime.  Advised to limit alcohol intake to 1 standard alcoholic beverage/day or less.  My Alliance: [email or web address]    Referrals  [facility]:  Deferred today, has adequate support   Research:  Discussed, declined today      Follow-up  Return to clinic for follow up visit in 6 months      Other important information:  Scheduling: [phone]   Lenice Llamas or our nurses: [phone]

## 2023-07-11 ENCOUNTER — Encounter: Admit: 2023-07-11 | Discharge: 2023-07-11 | Payer: MEDICARE | Primary: Neurology

## 2023-07-11 NOTE — Telephone Encounter
Pt rescheduled with Dr. Clarene Critchley as requested by provider for coordination of care.

## 2023-08-18 ENCOUNTER — Encounter: Admit: 2023-08-18 | Discharge: 2023-08-18 | Payer: MEDICARE | Primary: Neurology

## 2023-08-18 ENCOUNTER — Ambulatory Visit: Admit: 2023-08-18 | Discharge: 2023-08-19 | Payer: MEDICARE | Primary: Neurology

## 2023-08-18 DIAGNOSIS — F17200 Nicotine dependence, unspecified, uncomplicated: Secondary | ICD-10-CM

## 2023-08-18 DIAGNOSIS — R4182 Altered mental status, unspecified: Secondary | ICD-10-CM

## 2023-08-18 DIAGNOSIS — R413 Other amnesia: Secondary | ICD-10-CM

## 2023-08-18 DIAGNOSIS — R4189 Other symptoms and signs involving cognitive functions and awareness: Secondary | ICD-10-CM

## 2023-08-18 DIAGNOSIS — I639 Cerebral infarction, unspecified: Secondary | ICD-10-CM

## 2023-08-18 DIAGNOSIS — R569 Unspecified convulsions: Secondary | ICD-10-CM

## 2023-08-18 DIAGNOSIS — M1990 Unspecified osteoarthritis, unspecified site: Secondary | ICD-10-CM

## 2023-08-18 DIAGNOSIS — G4730 Sleep apnea, unspecified: Secondary | ICD-10-CM

## 2023-08-18 DIAGNOSIS — J302 Other seasonal allergic rhinitis: Secondary | ICD-10-CM

## 2023-08-18 DIAGNOSIS — F79 Unspecified intellectual disabilities: Secondary | ICD-10-CM

## 2023-08-18 DIAGNOSIS — H547 Unspecified visual loss: Secondary | ICD-10-CM

## 2023-08-18 DIAGNOSIS — R519 Generalized headaches: Secondary | ICD-10-CM

## 2023-08-18 DIAGNOSIS — G4733 Obstructive sleep apnea (adult) (pediatric): Secondary | ICD-10-CM

## 2023-08-18 NOTE — Telephone Encounter
An encounter has been created for documentation only (often for preparation of an upcoming appointment or for follow up on orders/imaging or records received) and patient does not need contact RN and did not miss a phone call or appointment.     Pre-visit planning for upcoming appointment with Dr.Luthra    New Pt  Gregory Gray T, DO [facility]     -DX:G47.33 (ICD-10-CM) - OSA (obstructive sleep apnea)     -Last SS: 12/11/2009 & Split Night: 10/14/2016

## 2023-08-18 NOTE — Assessment & Plan Note
Conitnue current PAP settings  Will obtain download from [facility]  Will obtain home sleep study without PAP for new baseline and order new machine  Follow up in 4months

## 2023-08-18 NOTE — Progress Notes
Date of Service: 08/18/2023    Telemedicine encounter was conducted using epic integrated Zoom telehealth interface, patient's consent for telemedicine appointment was obtained, patient was seen and evaluated on the above mentioned audio-video platform.     Gregory Miles, DO  [address]  [place],  [place] [address]    Dear Dr. Laureen Ochs,            Gregory Gray is a very pleasant 52 y.o. male presenting to [facility] clinic for evaluation of Sleep Problem.      Subjective:  Gregory Gray is a 52yo man that presents for evaluation of severe sleep apnea and transition to care at [facility]. His previous sleep study in 12/2009 showed AHI 81. Hypoxemia--less than 85% for 57 minutes. He had a split study 10/14/2016 that indicated CPAP 10cm H2O. He has been using PAP since that time. He has been using his machine and feels better with therapy. His wife reports that she can hear a quiet snore at times even with the machine. Family uses [facility] for DME.       Sleep regimen: bed time 10:30-11pm, no night time awakenings, wake time 6-7am. Feels rested when waking. Naps 1-2 times per week.     Sleep ROS: no choking/gasping spells, no dry mouth, no restless legs, no sleep related hallucinations, no dream enactments, no sleep walking/eating, no sleep paralysis.     ESS 08/18/2023:       08/11/2023     1:19 PM   Epworth Sleepiness Scale   Sitting and reading 1   Watching TV 1   Sitting inactive in a public place (e.g. a theater or a meeting) 1   As a passenger in a car for an hour without a break 1   Lying down to rest in the afternoon when circumstances permit 3   Sitting and talking to someone 1   Sitting quietly after a lunch without alcohol 3   In a car, while stopped for a few minutes in traffic 0   Epworth Sleepiness Scale Score 11             Review of Systems:  Sleep related ROS as above, no other new symptoms reported.       Past Medical History:   Diagnosis Date    Altered mental status 2017    Stroke    Arthritis 2022 Normal wear and tear on the body    Disorganized thinking 2017    Stroke    Generalized headaches 2017    After stroke    Intellectual disability 2017    Stroke    Memory loss 2023    Spouse noticed, patient denies    Obstructive sleep apnea 2009    Seasonal allergic reaction     Seizures (HCC) 2018    After stroke    Sleep apnea 2010    Sleep study    Smoker     Stroke (HCC)     Vision decreased 2017    Normal progression     Problem List:  2024-09: Obstructive sleep apnea  2017-11: Right homonymous hemianopsia  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 No pertinent surgical history.  Family History   Problem Relation Name Age of Onset    Hypertension Mother Gregory Gray     Cancer Paternal Grandfather Gregory Gray         colon cancer    Blood Clots Maternal Grandmother Gregory Gray         when admitted to hospital    Cancer Maternal Grandmother Gregory Gray         Cervical Cancer    Cancer Paternal Grandmother n/a      Social History     Socioeconomic History    Marital status: Married   Tobacco Use    Smoking status: Every Day     Current packs/day: 0.50     Average packs/day: 1.9 packs/day for 31.7 years (60.8 ttl pk-yrs)     Types: Cigars, Cigarettes     Start date: 70     Last attempt to quit: 08/18/2023     Passive exposure: Past (Wife)    Smokeless tobacco: Never    Tobacco comments:     Quit after stroke,  started again in 2022   Substance and Sexual Activity    Alcohol use: Yes     Comment: Occasionally a beer 05-22-23.    Drug use: Never    Sexual activity: Yes Partners: Female     Birth control/protection: None         Objective:          amLODIPine (NORVASC) 5 mg tablet Take one tablet by mouth daily.    ascorbic acid (VITAMIN C) 500 mg tablet Take one tablet by mouth at bedtime daily.    aspirin 81 mg chewable tablet Chew 1 tablet by mouth daily. Take with food.    rosuvastatin (CRESTOR) 5 mg tablet Take one tablet by mouth daily.    vitamins, multiple tablet Take one tablet by mouth at bedtime daily.     There were no vitals filed for this visit.  There is no height or weight on file to calculate BMI.     Physical Exam  Physical exam limited due to telemedicine encounter. Patient is alert and oriented, not in any distress, breathing does not appear labored, making good eye contact, speech is comprehensible, face is symmetrical, moving both upper extremities well, mood appropriate, no agitation, thought content appears to be normal, answering questions appropriately.          Assessment and Plan:  Problem List          SLEEP    Obstructive sleep apnea    Overview     2011: AHI 81, hypoxemia <85% for 57 minutes  2017:splint night. On set PAP of 10 cm H2O           Current Assessment & Plan     Conitnue current PAP settings  Will obtain download from [facility]  Will obtain home sleep study without PAP for new baseline and order new machine  Follow up in 4months              Obstructive sleep apnea  Conitnue current PAP settings  Will obtain download from [facility]  Will obtain home sleep study without PAP for new baseline and order new machine  Follow up in 4months      Patient was also counseled on good sleep hygiene, non-supine sleep, avoidance of alcohol before bedtime, regular exercise as tolerated  and weight loss to target body weight. Cautioned on the risks of operating motor vehicle or any heavy machinery when sleepy and advised against drowsy driving.   Ample opportunity was provided for questions and concerns. Patient is aware they can contact our office via phone or mychart for any further queries.        Thank you for the opportunity to participate in the care of this patient. We will continue to follow them in Sleep Medicine clinic. Return in about 4 months (around 12/18/2023) for Telehealth, In-Person, Dr. Clarene Critchley.    Sincerely,  Asencion Islam, MD  Sleep Medicine     Assistant Professor   [employer] [facility]    Orders Placed This Encounter    SLEEP STUDY    AMB REFERRAL TO HOME CARE       Total Time Today was 40 minutes in the following activities: Preparing to see the patient, Obtaining and/or reviewing separately obtained history, Performing a medically appropriate examination and/or evaluation, Counseling and educating the patient/family/caregiver, Documenting clinical information in the electronic or other health record, and communicating results to the patient/family/caregiver and Care coordination.  +

## 2023-08-19 DIAGNOSIS — G4733 Obstructive sleep apnea (adult) (pediatric): Secondary | ICD-10-CM

## 2023-08-22 ENCOUNTER — Encounter: Admit: 2023-08-22 | Discharge: 2023-08-22 | Payer: MEDICARE | Primary: Neurology

## 2023-08-22 NOTE — Telephone Encounter
DME: Marcelino Freestone  1. Please provide sleep data connection to pt's CPAP  2. Provide patient with all PAP related supplies:

## 2023-09-06 ENCOUNTER — Encounter: Admit: 2023-09-06 | Discharge: 2023-09-06 | Payer: MEDICARE | Primary: Neurology

## 2023-09-06 ENCOUNTER — Ambulatory Visit: Admit: 2023-09-06 | Discharge: 2023-09-06 | Payer: MEDICARE | Primary: Neurology

## 2023-09-06 DIAGNOSIS — G4730 Sleep apnea, unspecified: Secondary | ICD-10-CM

## 2023-09-06 DIAGNOSIS — H547 Unspecified visual loss: Secondary | ICD-10-CM

## 2023-09-06 DIAGNOSIS — D329 Benign neoplasm of meninges, unspecified: Secondary | ICD-10-CM

## 2023-09-06 DIAGNOSIS — R4189 Other symptoms and signs involving cognitive functions and awareness: Secondary | ICD-10-CM

## 2023-09-06 DIAGNOSIS — M1990 Unspecified osteoarthritis, unspecified site: Secondary | ICD-10-CM

## 2023-09-06 DIAGNOSIS — R4182 Altered mental status, unspecified: Secondary | ICD-10-CM

## 2023-09-06 DIAGNOSIS — R519 Generalized headaches: Secondary | ICD-10-CM

## 2023-09-06 DIAGNOSIS — G4733 Obstructive sleep apnea (adult) (pediatric): Secondary | ICD-10-CM

## 2023-09-06 DIAGNOSIS — R5383 Other fatigue: Secondary | ICD-10-CM

## 2023-09-06 DIAGNOSIS — F79 Unspecified intellectual disabilities: Secondary | ICD-10-CM

## 2023-09-06 DIAGNOSIS — I639 Cerebral infarction, unspecified: Secondary | ICD-10-CM

## 2023-09-06 DIAGNOSIS — J302 Other seasonal allergic rhinitis: Secondary | ICD-10-CM

## 2023-09-06 DIAGNOSIS — R569 Unspecified convulsions: Secondary | ICD-10-CM

## 2023-09-06 DIAGNOSIS — Z8673 Personal history of transient ischemic attack (TIA), and cerebral infarction without residual deficits: Secondary | ICD-10-CM

## 2023-09-06 DIAGNOSIS — R413 Other amnesia: Secondary | ICD-10-CM

## 2023-09-06 DIAGNOSIS — F17200 Nicotine dependence, unspecified, uncomplicated: Secondary | ICD-10-CM

## 2023-09-06 DIAGNOSIS — F09 Unspecified mental disorder due to known physiological condition: Secondary | ICD-10-CM

## 2023-09-07 ENCOUNTER — Encounter: Admit: 2023-09-07 | Discharge: 2023-09-07 | Payer: MEDICARE | Primary: Neurology

## 2023-09-07 LAB — SYPHILIS AB SCREEN: SYPHILIS AB, TOTAL: NEGATIVE

## 2023-09-07 LAB — TSH WITH FREE T4 REFLEX: TSH: 0.4 uU/mL (ref 0.35–5.00)

## 2023-11-16 ENCOUNTER — Encounter: Admit: 2023-11-16 | Discharge: 2023-11-16 | Payer: MEDICARE | Primary: Neurology

## 2023-11-22 NOTE — Progress Notes
MEMORY CARE CLINIC EVALUATION    Chief Complaint:  Gregory Gray is a 52 y.o. old male here for a follow-up cognitive evaluation.   Onset:  2022  Course: static    This patient is accompanied in the office by his wife, Gregory Gray who contributes to the history.   _______________________________________________________________________  _____________________HISTORY__________________________________________    HPI:  Last visit on 05/22/2023, with myself. We discussed lumbar puncture to confirm if Alzheimer's Disease is playing a role in cognition. Donepezil deferred.     Since last visit, his cognition has been about the same. They haven't noticed any big changes in his memory.    Recent changes in health or hospitalizations? No    Function  Living situation: Lives with wife  Eating: Independent  Toileting: Independent  Bathing: Independent  Dressing: Independent  Meal Preparation: Independent, can make a simple meal. Wife is the main cook  Usual household chores: Independent, Field seismologist  Medication administration: Dependent, wife sets them up in a pillbox  Managing finances: Dependent, wife took over after his stroke. He would struggle to calculate a tip or make change  Activity: Gardens, Psychologist, sport and exercise and Neuropsychiatric History (i.e., depression, agitation, behavior / personality):   PHQ-2 Score: 0 (11/23/2023  9:42 AM)    Depression: No  Anxiety: No  Agitation/Behavior/Personality changes: Increased agitation and frustration, manageable at this time  Visual hallucinations: No      ROS:  Parkinsonism / tremors: No  Gait Changes: R leg weakness from previous stroke, stumbles occasionally, more stooped posture   Falls: No  Vision changes: No  Hearing changes: No  Speech changes: expressive aphasia   Unintentional Wt loss: No  Swallowing difficulties: No  Sleep issues: Difficulty with sleep initiation or maintenance. Prefers to keep tv on when sleeping. Has sleep apnea, occasionally wears CPAP. No known RBD. Tells me his sleep varies  B/B Incontinence: No    Past Medical History:    Altered mental status    Arthritis    Dementia (HCC)    Disorganized thinking    Generalized headaches    Hypertension    Intellectual disability    Memory loss    Obstructive sleep apnea    Seasonal allergic reaction    Seizures (HCC)    Sleep apnea    Smoker    Stroke (HCC)    Vision decreased       Family History   Problem Relation Name Age of Onset    Hypertension Mother [name]     Cancer Paternal Grandfather [name]         colon cancer    Blood Clots Maternal Grandmother [name]         when admitted to hospital    Cancer Maternal Grandmother [name]         Cervical Cancer    Cancer Paternal Grandmother n/a        Social History     Socioeconomic History    Marital status: Married   Tobacco Use    Smoking status: Every Day     Average packs/day: 1 pack/day for 17.0 years (17.0 ttl pk-yrs)     Types: Cigars, Cigarettes     Start date: 2000     Last attempt to quit: 08/18/2023     Years since quitting: 0.2     Passive exposure: Past (Wife)    Smokeless tobacco: Never    Tobacco comments:     Quit after stroke,  started again in 2022.  Cigarettes 2000-2017, still smokes cigars 11-23-23.    Substance and Sexual Activity    Alcohol use: Not Currently     Comment: Maybe once or twice a year    Drug use: Never    Sexual activity: Yes     Partners: Female     Birth control/protection: None       Care Partner Burden and Social Support:  [name] does partially rely on care giving.  Care partner burden expressed? No    Safety Assessment:  Driving? Yes, staying more in familiar areas and routine     Disorientation when driving? No     Recent traffic violations or accidents? No     Passenger concerns? No  Safety concerns in home? No   Wandering? No    Advanced Care Planning  DPOA:  Yes   Advance Directive: Yes    Medications:   amLODIPine (NORVASC) 5 mg tablet Take one tablet by mouth daily.    ascorbic acid (VITAMIN C) 500 mg tablet Take one tablet by mouth at bedtime daily.    aspirin 81 mg chewable tablet Chew 1 tablet by mouth daily. Take with food.    rosuvastatin (CRESTOR) 5 mg tablet Take one tablet by mouth daily.    vitamins, multiple tablet Take one tablet by mouth at bedtime daily.       Medication Review:   I have personally reviewed the medication list and am making recommendations as listed in the plan below.   Dominque and his wife manages medication at home for [name].   The following medications have been identified as potential high risk medications: none    Labs/Imaging:  TSH   Date Value Ref Range Status   09/06/2023 0.46 0.35 - 5.00 MCU/ML Final     01/23/2023 CT Head  Unchanged prior L parietal MCA infarct with ex vacuo dilation of the posterior horn of L lateral ventricle. No abnormal enhancement or ICH        04/23/2023 MRI Head  IMPRESSION     1. Chronic left MCA distribution infarct/s   2. No acute/recent infarct   3. Mild chronic microvascular ischemic changes.   4. Small ovoid 1 cm extra-axial enhancing lesion at the anterior left   middle cranial fossa. The leading diagnostic consideration is a small   meningioma. Potential other possibilities include a varix or less likely   an atypical aneurysm. If clinically indicated, CTA or MRA is recommended   for further evaluation           Physical Exam  Vital Signs: BP 135/89  - Pulse 73  - Ht 182.9 cm (6')  - Wt 102.6 kg (226 lb 3.2 oz)  - BMI 30.68 kg/m?     BP Readings from Last 3 Encounters:   11/23/23 135/89   09/06/23 121/83   05/22/23 (!) 142/85     Wt Readings from Last 6 Encounters:   11/23/23 102.6 kg (226 lb 3.2 oz)   09/06/23 98.4 kg (217 lb)   05/22/23 99.8 kg (220 lb)   02/18/23 101.1 kg (222 lb 12.8 oz)   10/03/17 109.8 kg (242 lb 1.9 oz)   12/27/16 106.1 kg (234 lb)       General  Groomed with appropriate hygiene. Dress appropriate for season/situation    Mental Status  Alert, scored 6/8 for orientation on STMS  Speech is fluent with intact comprehension, naming and repetition.    Cardio/Pulm  Non labored at rest, good effort    Cranial Nerves  Extraocular movements intact  Pupils equal and reactive to light  No facial asymmetry  Shoulder shrug symmetrical  Tongue midline  All other CNs normal    Motor  Full strength in the upper and lower extremities  Rigidity: No rigidity  Bradykinesia: Absent  Rest tremor: Not present    Coordination  Normal  Finger Taps WNL  Toe Taps WNL    Gait  Rising from chair: Normal - without pushing off  Posture: Normal  Gait initiation: Normal  Stride Length: Normal  Arm Swing: Normal  Turns: Normal    Psych  Affect and mood congruent and appropriate to situation  Thought process is logical    Neurobehavioral Testing:  02/2023 [facility] 19/30  05/2023 [facility] 20/38    Short Test of Mental Status  Orientation: 6/8  Attention: 4/7  Immediate Recall: 4/4 (number of trials: 3, -2)  Calculations: 0/4  Abstraction: 3/3  Cube: 2/2    Clock: 1/2    Information: 3/4  Delayed Recall: 3/4    Total Score: 24/38       Neuropsychology Summary and Impression (refer to Neuropsychologist progress note for complete documentation of tests performed and results):  11/2016 Neuropsych Evaluation Dr. Rudi Heap  Impressions:  This neuropsychological evaluation revealed that the patient continues to have expressive and receptive aphasia that are impacting his performance on most auditory tasks.  For example, his estimated overall intellectual functioning was in the borderline range on the WASI-II and, in comparison, is average on a non-verbal intelligence test (TONI-4).  In addition, he had significant difficulties on tasks of vocabulary, verbal fluency, and naming, while his vocabulary on a picture-vocabulary test that allowed him to point to the picture best fitting a word was average and above the 12th grade equivalent.  His aphasia also impacted his performance on verbal memory tasks and a verbal reasoning task (all within the impaired range). However, his visual-spatial abilities and visual reasoning were average.  His immediate simple visual memory was average but delayed was mildly impaired.  Similarly his immediate complex visual memory was low average but delayed was mildly impaired.  On the latter task, his performance improved to within the average range with cuing.  His performance on visual motor speed tasks was impaired likely in part because of difficulties with letters and numbers (Trail [place]), although his visual motor speed was also slowed on a fine motor dexterity task (severely impaired bilaterally).  His gross motor strength was low average bilaterally.  His basic sensory perceptual functioning was intact with compensation noted in his right visual field on tasks.  He endorsed minimal depressed mood and anxiety symptoms on screens.      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 Sarajane Marek  Test Results: Patient performed below expectations on a cognitive screening measure. On a word learning and memory measure, patient performed below expectations on learning with poor delayed recall. He minimally benefited from a recognition format. Patient performed below expectations on measures of basic attention, working memory, processing speed, phonemic fluency, and set-switching. He performed within expectations on measures of semantic fluency and confrontation naming.      Summary and Impression: Patient displayed deficits in memory (encoding and retention), executive functioning, processing speed, and attention. He otherwise performed within expectations.      Patient meets criteria  for mild dementia. His performance may reflect medial temporal lobe dysfunction and frontal/subcortical/parietal lobe dysfunction. He displayed some expressive language dysfunction which may have impacted his performance, though his language was largely intact on objective measures. A possible etiology is his history of stroke. However, it is unclear if his stroke fully explains his performance. An emerging neurodegenerative condition cannot be ruled out, specifically Alzheimer's disease, given his wife's report of progressive cognitive decline.    Diagnosis and Plan  1. Acute ischemic left MCA stroke (HCC)        2. Expressive aphasia            FAST score: 4. Mild Dementia-IADL's become affected, such as bill paying, cooking, cleaning, traveling    Decision Making  Patient does not appear to have remarkable difficulties with their medical decision-making capacity.      CARE PLAN  Further Evaluation  No further testing at this time.     Cognitive therapy plan  No medications indicated at this time  Risks and benefits of sleep apnea discussed.  Obstructive sleep apnea symptoms are snoring, increased breathing effort while asleep, and drops in oxygen while asleep. This causes harm to the body in multiple ways:  Decreased time in restorative sleep cycle which lead to daytime sleepiness, morning headaches, decreased concentration, anxiety , depression, traffic accidents, and poor quality of life  The repeated drop in oxygen levels triggers the release of stress hormones in the body that leads to increased levels of inflammation throughout the body. This inflammation can cause cognitive decline, high blood pressure, heart disease, insulin resistance, and fat accumulation in the liver which can lead to liver failure.  Treating sleep apnea appropriately can often improve or stabilize all of these things. We recommend continuing to wear your CPAP at night.    Neuropsychiatric therapy  No medications needed at this time    Lifestyle Recommendations  Encouraged to stay active mentally, physically, and socially (reading, exercising, visiting with friends and family)  Encouraged heart healthy diet  Driving: Continue with current driving limitations.     My Alliance: [email or web address]    Referrals  Cognitive Care Network:  Deferred today, has adequate support   Research:  Previously discussed and declined  General educational materials provided on lifestyle interventions for memory care    Follow-up  Return to clinic in 1 year      Other important information:  Scheduling: [phone]   Lenice Llamas or our nurses: [phone]    Total of 45 minutes were spent on the same day of the visit including preparing to see the patient, obtaining and/or reviewing separately obtained history, performing a medically appropriate examination and/or evaluation, counseling and educating the patient/family/caregiver, ordering medications, tests, or procedures, referring and communication with other health care professionals, documenting clinical information in the electronic or other health record, independently interpreting results and communicating results to the patient/family/caregiver, and care coordination.       How to Keep Your Brain Healthy   Physical exercise: Physical exercise allows more blood flow to get to your brain.  All exercise is better than none, but gardening and walking are like tier 1 of exercise and will not get your heart rate higher than 100 in many instances. Weight training, yoga, and resistance bands are like tier 2 level. We see the real benefits at tier 3 level of exercise, which involves aerobic or cardiovascular exercise. Cardio exercise involves getting your heart rate up and a real sweat going.  This level of  exercise can release BDNF (brain derived neurotrophic factor) which helps stabilize neurons - we think of it as fertilizer for those brain cells.  Research studies suggest symptoms progress slower and brain atrophy slows in cardio intensive exercise groups.  This can be done with high intensity workouts, running, biking, stationary biking, rowing machine, or in a swimming pool.  Talk to your health care provider before doing strenuous exercise. The ultimate goal is to get 150 minutes of moderate to intense exercise each week, most easily divided up into 30 minutes per day 5 days per week. It is recommended that one eases into this and slowly increases their amount of exercise over weeks to months. If you have never exercised before or are in poor exercise shape, it is recommended that you seek a personal trainer or join a local gym with programs directed for your age/exercise level.     Diet: Eating a heart-healthy diet may help protect brain function. Further information about the Mediterranean/MIND diet is listed below.  Talk to your primary medical provider about your specific diet needs.     Sleep: The toxic proteins that we all build up during the day and are associated with neurodegeneration of neurons are likely only cleared during deep, slow wave stages of sleep. If deep sleep is disrupted, fewer proteins are cleared. Sleep is also important for memory consolidation and restorative aspects to help with attention of tasks throughout the next day.  The average person requires 7-9 hours of quality sleep per night.  Try to maintain a sleep routine.  Avoid caffeine in the afternoon and heavy meals after 7 pm.  Talk to your local medical provider if you are having problems with sleeping (for example: insomnia, obstructive sleep apnea, excessive daytime sleepiness, or not sleeping enough).     Mental exercise: Push your brain with new things and new places.  Take a community education class, or attend a lecture at [employer]. Doing activities, such as reading, learning an instrument or a new language is healthy for our brains. Utilize cognitive training apps or programs. There is no evidence currently that one works better than other, but make sure not to focus on one thing (crosswords, Sudoku puzzles, computer-based games, etc.)  Make sure to have a broad cognitive program.     Stress Reduction: Simplify your life as much as possible. Meditate, do yoga or tai chi, or perform deep breathing, etc.  Stress will amplify any underlying problem.       Spiritual Fitness: Find something you are passionate about and that gives your life meaning and purpose each day.  Get involved.  Stay active in your local community and with social functions.     Getting involved in research: This can be empowering for many who are involved in research. It gives individuals a sense of purpose and fighting back against this disease. The first person who will be cured of a neurodegenerative disease will be from a research study and helping [place] move that science forward is a major contribution.     Mediterranean Diet   The Mediterranean Diet and the MIND diet are the best studied across neurodegenerative diseases.  Both have shown a reduced risk of Alzheimer's disease across multiple studies, ranging from 35-50% reduced risk.  The Mediterranean diet is considered to be a heart-healthy diet as well.  Interested in trying the Mediterranean diet? These tips will help you get started:  Eat more fruits and vegetables. Aim for 7 to 10 servings  a day of fruit and vegetables.  Opt for whole grains. Switch to whole-grain bread, cereal and pasta. Experiment with other whole grains, such as bulgur and farro.  Use healthy fats. Try olive oil as a replacement for butter when cooking. Instead of putting butter or margarine on bread, try dipping it in flavored olive oil.  Eat more seafood. Eat fish twice a week. Fresh or water-packed tuna, salmon, trout, mackerel and herring are healthy choices. Grilled fish tastes good and requires little cleanup. Avoid deep-fried fish.  Reduce red meat. Substitute fish, poultry or beans for meat. If you eat meat, make sure it's lean and keep portions small.  Enjoy some dairy. Eat low-fat [place] or plain yogurt and small amounts of a variety of cheeses.  Spice it up. Herbs and spices boost flavor and lessen the need for salt.     Utilize a high rated book on the subject or a good Solicitor as well: https://www.helpguide.org/articles/diets/the-mediterranean-diet.htm

## 2023-11-23 ENCOUNTER — Encounter: Admit: 2023-11-23 | Discharge: 2023-11-23 | Payer: MEDICARE | Primary: Neurology

## 2023-11-23 ENCOUNTER — Ambulatory Visit: Admit: 2023-11-23 | Discharge: 2023-11-23 | Payer: MEDICARE | Primary: Neurology

## 2023-11-23 DIAGNOSIS — R4701 Aphasia: Secondary | ICD-10-CM

## 2023-11-23 DIAGNOSIS — I63512 Cerebral infarction due to unspecified occlusion or stenosis of left middle cerebral artery: Secondary | ICD-10-CM

## 2023-11-23 NOTE — Patient Instructions
CARE PLAN  Further Evaluation  No further testing at this time.     Cognitive therapy plan  No medications indicated at this time  Risks and benefits of sleep apnea discussed.  Obstructive sleep apnea symptoms are snoring, increased breathing effort while asleep, and drops in oxygen while asleep. This causes harm to the body in multiple ways:  Decreased time in restorative sleep cycle which lead to daytime sleepiness, morning headaches, decreased concentration, anxiety , depression, traffic accidents, and poor quality of life  The repeated drop in oxygen levels triggers the release of stress hormones in the body that leads to increased levels of inflammation throughout the body. This inflammation can cause cognitive decline, high blood pressure, heart disease, insulin resistance, and fat accumulation in the liver which can lead to liver failure.  Treating sleep apnea appropriately can often improve or stabilize all of these things. We recommend continuing to wear your CPAP at night.    Neuropsychiatric therapy  No medications needed at this time    Lifestyle Recommendations  Encouraged to stay active mentally, physically, and socially (reading, exercising, visiting with friends and family)  Encouraged heart healthy diet  Driving: Continue with current driving limitations.     My Alliance: [email or web address]    Referrals  Cognitive Care Network:  Deferred today, has adequate support   Research:  Previously discussed and declined  General educational materials provided on lifestyle interventions for memory care    Follow-up  Return to clinic in 1 year      Other important information:  Scheduling: [phone]   Lenice Llamas or our nurses: [phone]

## 2023-12-19 ENCOUNTER — Ambulatory Visit: Admit: 2023-12-19 | Discharge: 2023-12-19 | Payer: MEDICARE | Primary: Neurology

## 2023-12-19 ENCOUNTER — Encounter: Admit: 2023-12-19 | Discharge: 2023-12-19 | Payer: MEDICARE | Primary: Neurology

## 2023-12-19 ENCOUNTER — Ambulatory Visit: Admit: 2023-12-19 | Discharge: 2023-12-20 | Payer: MEDICARE | Primary: Neurology

## 2023-12-20 ENCOUNTER — Encounter: Admit: 2023-12-20 | Discharge: 2023-12-20 | Payer: MEDICARE | Primary: Neurology

## 2024-01-02 IMAGING — CT LOW_DOSE_LUNG(Adult)
4 of 8 series · 14 of 36 positions shown, 16 images · non-contrast
Comparison: none

[Series 2: lung ax 1.00 br44 s3 · axial · 0.60mm/px · z∈[+1925,+1946]mm · 2 of 62 slices shown (1 of 2)]
[im 6/62  lung]
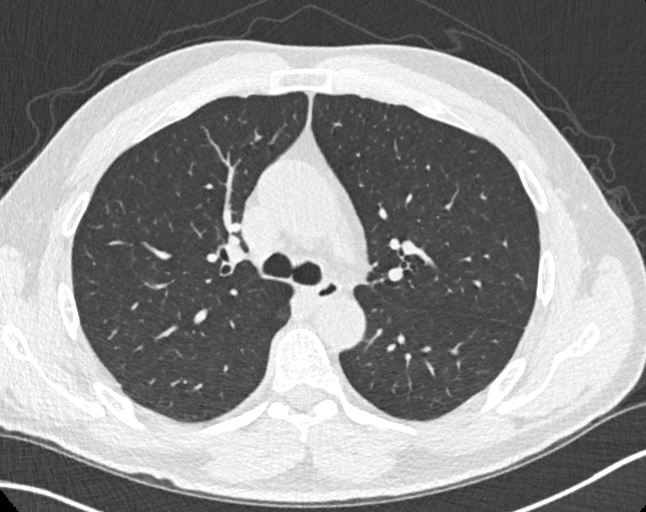
[im 16/62  lung]
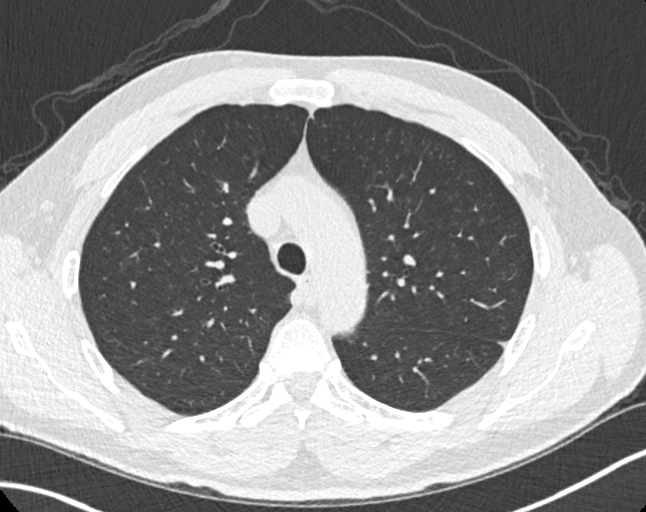

[Series 8: lung ax 1.00 br44 s3 · axial · 0.60mm/px · z∈[+1918,+2047]mm · 7 of 43 slices shown, 9 images (2 of 2)]
[im 6/43  mediastinal]
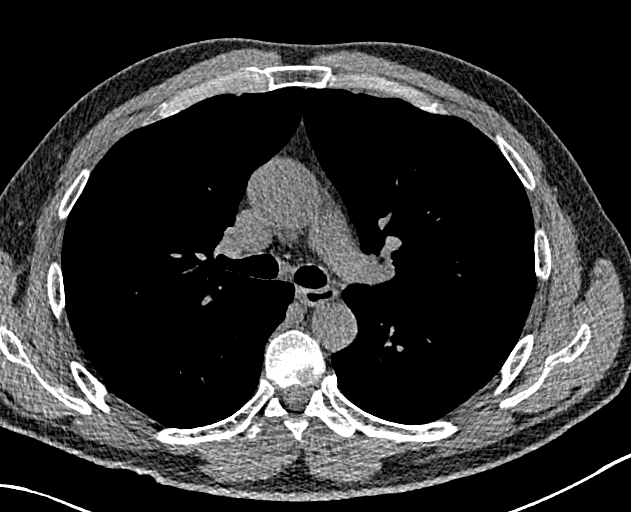
[im 6/43  lung]
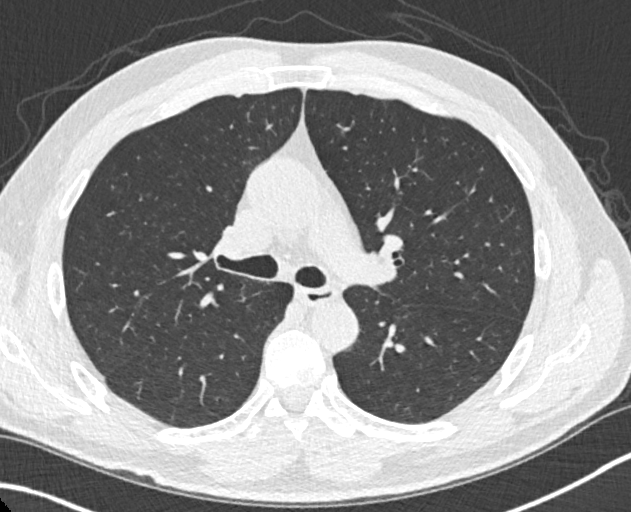
[im 11/43  lung]
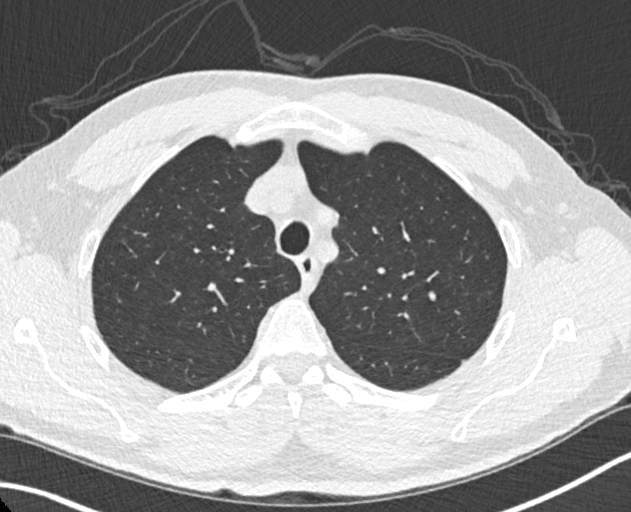
[im 16/43  lung]
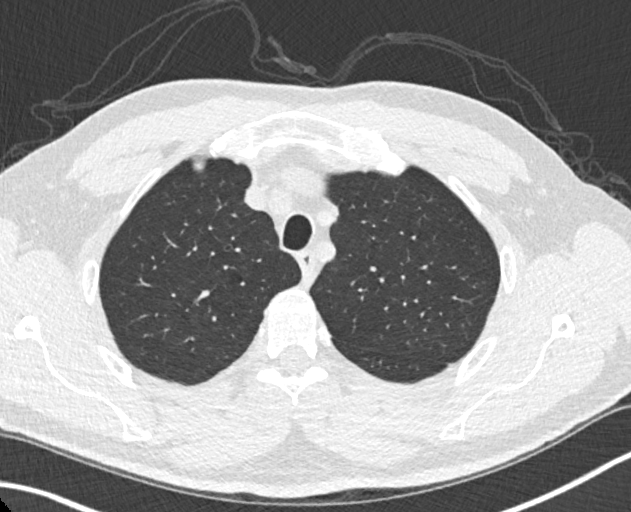
[im 22/43  lung]
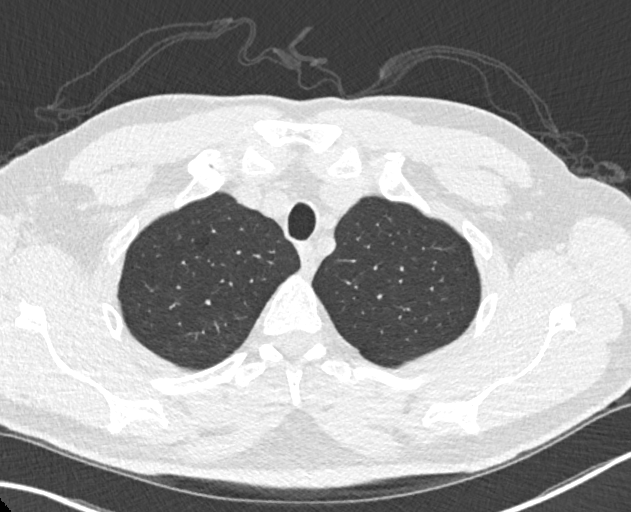
[im 27/43  mediastinal]
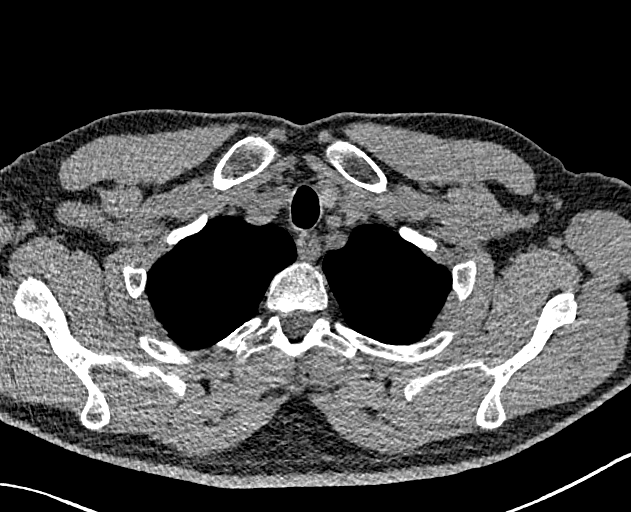
[im 27/43  lung]
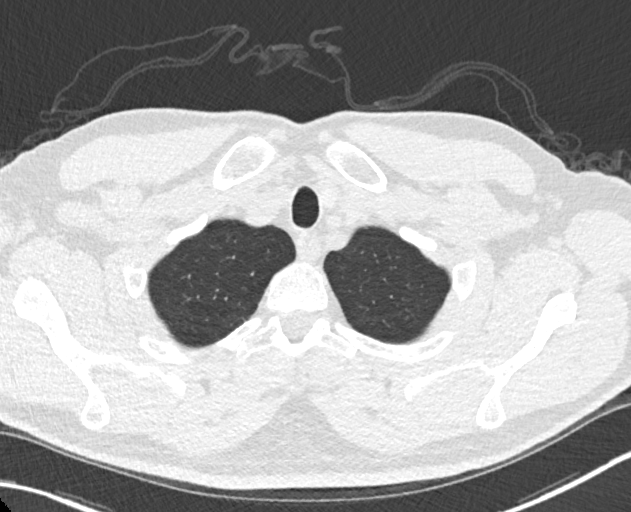
[im 32/43  lung]
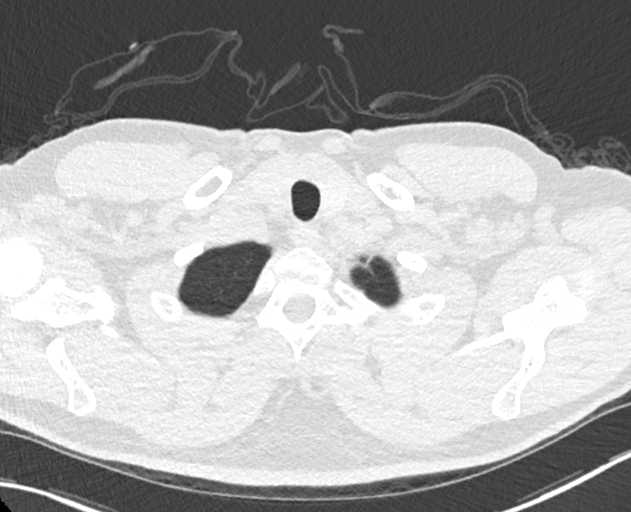
[im 37/43  lung]
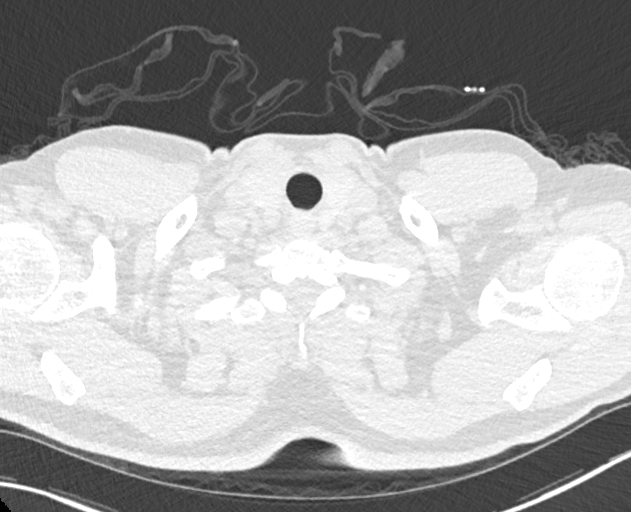

[Series 10: lung cor 1.00 br44 s3 · coronal · 0.69mm/px · 1 of 25 slices shown]
[im 13/25  lung]
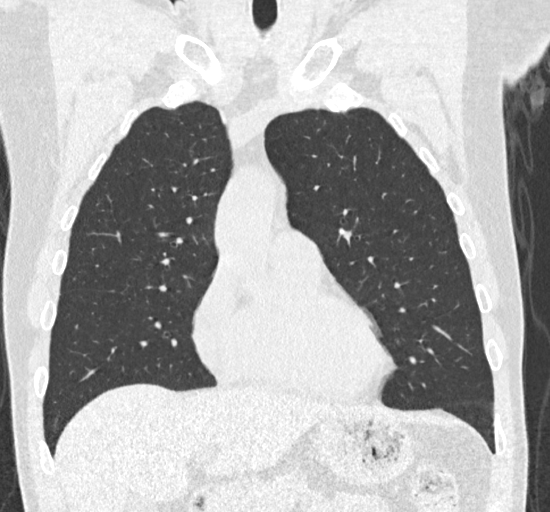

[Series 17: lung 1.00 br60 s3 cad · axial · 0.74mm/px · z∈[+1719,+2029]mm · 4 of 31 slices shown]
[im 7/31  lung]
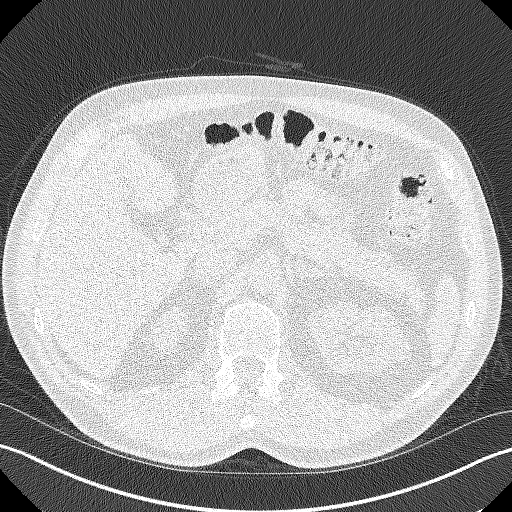
[im 13/31  lung]
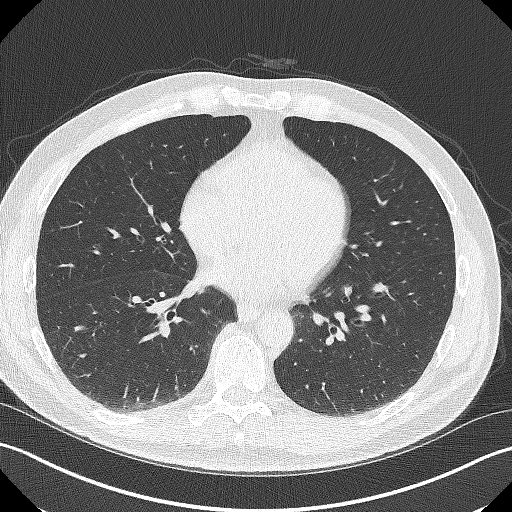
[im 19/31  lung]
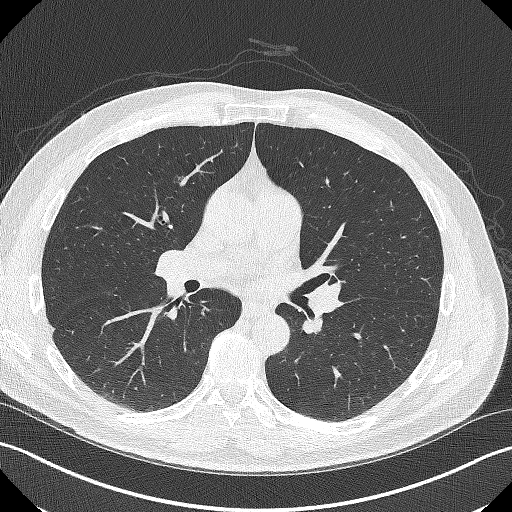
[im 25/31  lung]
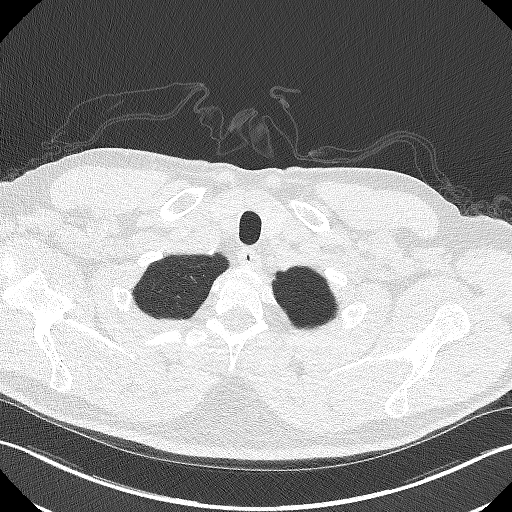

[14 of 36 positions shown; findings below may reference images not displayed]

**ADDENDUM**
ADDENDUM

LUNG RADS 1-Negative.

CITATION: Lung cancer screening categorization and recommendations per [HOSPITAL]
Lung-RADS version 1.0 ([URL]

TD/TT: /

EXAM

CT CHEST

INDICATION

Former smoker

TECHNIQUE

CT of the chest was performed. No intravenous contrast was administered.

All CT scans at this facility use dose modulation, iterative reconstruction, and/or weight based
dosing when appropriate to reduce radiation dose to as low as reasonably achievable.

# of CT scans in the past year: 0 # of Myocardial perfusion scans this past year: 0

COMPARISONS

None available at the time of dictation.

FINDINGS

Lower neck: [Unremarkable]

Lymphadenopathy: [Multiple non-pathologically enlarged mediastinal lymph nodes.]

Cardiovascular: [Normal cardiac size.] [No pericardial effusion.] [Coronary artery calcifications at
the left anterior descending artery.] [Normal caliber of the great vessels.]

Upper abdomen: [Unremarkable]

Musculoskeletal: [No acute fracture or aggressive focal osseous lesion.] [Multilevel degenerative
change of the visualized spine.]

Lung parenchyma/pleural space: No suspicious focal airspace opacity, nodule, or consolidation. [No
pleural effusion or pneumothorax.]

Central Airways: Patent

IMPRESSION
1. No CT evidence of a suspicious pulmonary nodule.

Tech Notes:

SCREENING. FORMER 1PPD SMOKER X 30 YEARS. QUIT X 8 YEARS AGO. NO CURRENT CHEST COMPLAINTS.
HB

## 2024-02-12 ENCOUNTER — Encounter: Admit: 2024-02-12 | Discharge: 2024-02-12 | Payer: MEDICARE | Primary: Neurology

## 2024-03-25 ENCOUNTER — Encounter: Admit: 2024-03-25 | Discharge: 2024-03-25 | Payer: MEDICARE | Primary: Neurology

## 2024-04-01 ENCOUNTER — Encounter: Admit: 2024-04-01 | Discharge: 2024-04-01 | Payer: MEDICARE

## 2024-04-01 ENCOUNTER — Ambulatory Visit: Admit: 2024-04-01 | Discharge: 2024-04-02 | Payer: MEDICARE

## 2024-04-01 DIAGNOSIS — R4701 Aphasia: Secondary | ICD-10-CM

## 2024-04-01 DIAGNOSIS — E782 Mixed hyperlipidemia: Secondary | ICD-10-CM

## 2024-04-01 DIAGNOSIS — E538 Deficiency of other specified B group vitamins: Secondary | ICD-10-CM

## 2024-04-01 DIAGNOSIS — I10 Essential (primary) hypertension: Secondary | ICD-10-CM

## 2024-04-01 DIAGNOSIS — F03A Mild dementia without behavioral disturbance, psychotic disturbance, mood disturbance, or anxiety, unspecified dementia type (CMS-HCC): Secondary | ICD-10-CM

## 2024-04-01 DIAGNOSIS — G4489 Other headache syndrome: Secondary | ICD-10-CM

## 2024-04-01 DIAGNOSIS — G4733 Obstructive sleep apnea (adult) (pediatric): Secondary | ICD-10-CM

## 2024-04-01 DIAGNOSIS — Z8673 Personal history of transient ischemic attack (TIA), and cerebral infarction without residual deficits: Secondary | ICD-10-CM

## 2024-04-01 NOTE — Progress Notes
 Date of Service: 04/01/2024    Subjective:             Gregory Gray is a 53 y.o. 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    History of Present Illness  Mr. Gregory Gray initially presented in 08/2016.  At that time he presented with sudden onset aphasia and R sided hemiparesis.  He was seen at OSH and had an NIH of 20 and was given tPA and transferred for possible EVT.   Upon arrival NIH 10.  CTA with mild to distal L M2/M3 occlusion with significant penumbra.  He went to IR, EVT was attempted but TICI 0.    Etiology of stroke remains cryptogenic.  HE had an TTE which showed EF of 65%, normal ventricular size and function, normal atrial size, no shunt or significant valvular abnormalities.  He underwent TEE which was unremarkable.  29 day event monitor was obtained but only showed a single baseline transmission on September 20th which demonstrated sinus rhythm, raising question if monitor was defective.   CT C/A/P was negative for malignancy.  Hypercoag workup was obtained.  Initial cardiolipin IgM was elevated at 18.1, but IgG was normal at 3.6.  Hex Lupus anticoagulant was elevated at 13.   DRVV was normal.  He had normal Beta 2 GP IgM/IgG, protein C/S, Factor V Leiden, activated protein C resistance, ESR, CRP.  A1c was a 5.6%, LDL at 85.  HE was started on ASA and statin.  He was discharged to IPR.  HE had followup in Neurology clinic in 12/2016 and LINQ device was ordered.   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  He was evaluated with neuropsych testing in 2017 and had scored 16/20 on simple items and 14/20 on complex items on boston diagnostic aphasia exam.  Further testing showed mild anxiety and no significant depressive symptomology.  HE was taking sertraline.  He had followup which showed improved scoring on his BDAE and CLOX showing improved recovery.  Further testing with WASI-II IQ score was 71m limited due to aphasia, perceptual reasoning was superior to verbal comprehension.  He had difficulties with vocabulary, verbal fluency, and naming.  Delayed memory was mildly impaired and visual motor speed was also mildly impaired.  IT was recommended he continue therapies.     He established care again in 04/2023.  He has not had any further stroke events.   Due to memory difficulties he had a follow-up MRI in 04/2023 which showed stable findings of old L MCA stroke and small ovoid 1cm extra-axial enhancing lesion likely small meningioma vs less likely atypical aneurysm.  Repeat MRI in 12/2023 showed prior L MCA infarcts, no meningioma was noted.  MRA was obtained and did not show aneurysm, but showed prominent L anterior and lateral temporal veins.       Regarding further workup for his stroke, repeat hypercoag testing was sent which has not yet been obtained.  Lipid panel, A1c, and B12 was also sent and has not yet been obtained. Event monitor was ordered which did not show any evidence of atrial arrythmias.     He underwent neuropsych testing in 11/2023 and was found to have mild dementia with deficits in memory (encoding and retention, executive functioning, processing speed and attention).  He did not wish to undergo further LP or testing for alzheimer's.  It was recommended he continue to wear his CPAP for his sleep apnea.            Interval Events:    NO further stroke like events.   He does report some difficulty with sleeping and daytime somnolence.  HE does not always wear his CPAP consistently.  He feels better per wife when he does.  He can take naps during the day if needed.   He does sleep with the TV on, which is disruptive to his wife's sleep.   He is taking B12 daily.       -9/ 2017: CTA head:   1.  Left distal M2/M3 arterial occlusion with associated hyperdense vessel   on noncontrast imaging.   2.  Paucity of distal arterial opacification in the left anterior lateral   frontal lobe without corresponding arterial occlusion identified.   3.  No obvious loss of gray-white differentiation or intracranial   hemorrhage.      -08/2016: CTA neck:   1.  Widely patent extracranial carotid and vertebral arteries.   2.  Ill-defined linear hypodensity in the proximal left internal carotid   artery favored for flow artifact. Carotid web is thought to be less   likely.      -08/2016: CT perfusion:   Small area of completed infarct within the posterior left MCA distribution   and small area in the left anterior medial frontal lobe deep white matter.   There are larger areas of parenchyma surrounding these infarcts with   decreased cerebral blood flow but preserved cerebral blood volume   suggesting tissue at risk.      -IR Arteriogram  1. Acute occlusion of mid left M2 segment involving a dominant superior   division of the MCA.   2. Unsuccessful mechanical thrombectomy. No recanalization was achieved.   (TICI 0)      -08/2016 MRI Head w/out    Interval expected evolution of moderate to large left MCA distribution   infarct with mild regional mass effect. No evidence of hemorrhagic   transformation or hydrocephalus.      -01/2023: CT Head /wout:  Unchanged prior L parietal MCA infarct with ex vacuo dilation of the posterior horn of L lateral ventricle.  No abnormal enhancement or ICH.  (Reviewed)     -08/2016 CT C/A/P  CHEST:   Normal CT of the chest.      ABDOMEN AND PELVIS:      1.  No abdominopelvic mass or lymphadenopathy.   2.  Right iliac fossa stranding likely from recent arteriogram. No   well-defined hematoma or fluid collection.         TTE 08/2016  Interpretation Summary   ?The left ventricle is normal in size and function, estimated ejection fraction is 65%.  ?The right ventricle is normal in size and function.  ?The left and right atria are normal in size. Agitated saline contrast injection with and without Valsalva demonstrates no evidence of right to left interatrial shunt.  ?No significant valve disease is identified.  ?Inadequate tricuspid regurgitation signal, unable to accurately estimate PA systolic pressure with this study.  ?           No gross mass, thrombus or vegetation is identified.      No prior studies are available for comparison        TEE 08/2016  Interpretation Summary   1.  Normal LV size, wall motion with EF =65%.   2.          Normal RV size and function.   3.No evidence of intracardiac shunt with on color flow or agitated normal saline (bubble study)        2017:  Event monitor:  A looping event recorder was utilized for 29 days there is a single baseline transmission on September 20 which demonstrated sinus rhythm.  There are no additional transmissions.     2017: Diagnostic labs and hypercoag:  Initial cardiolipin IgM was elevated at 18.1, but IgG was normal at 3.6.  Hex Lupus anticoagulant was elevated at 13.   DRVV was normal.  He had normal Beta 2 GP IgM/IgG, protein C/S, Factor V Leiden, activated protein C resistance, ESR, CRP.  A1c was a 5.6%, LDL at 85.     MRI head 04/2023:  1. Chronic left MCA distribution infarct/s2. No acute/recent infarct   3. Mild chronic microvascular ischemic changes.4. Small ovoid 1 cm extra-axial enhancing lesion at the anterior leftmiddle cranial fossa. The leading diagnostic consideration is a small meningioma. Potential other possibilities include a varix or less likely   an atypical aneurysm. If clinically indicated, CTA or MRA is recommended for further evaluation     Repeat Event Monitor 04/2023: 30-day    1.  Baseline sinus rhythm with average heart rate of 78 bpm.  Range between 51 to 147 bpm  2.  No evidence of atrial fibrillation noted  3.  Patient symptoms of lightheadedness tiredness and fatigue do not correlate with any arrhythmias.  4.  No other evidence of any arrhythmias noted     MRI head w/ and w/out 11/2023: 1. Extensive old left posterior MCA distribution infarct with   encephalomalacia.   2. Additional small old infarcts in anterior left MCA distribution   including anterior frontal opercular region, parasagittal frontal and   parietal lobes.   3. No meningioma is visible.   4. Prominent left anterior and lateral temporal vein accounts for the   enhancing structure and medial left middle cranial fossa on prior MRI.   MRA head w/out 11/2023:  1.Reduced caliber left middle cerebral artery and attenuation of distal   branches compatible with old infarct.   2. Normal appearance of remaining major intracranial arteries.         NeuroPsych testing 05/2023:  20/38 on STMS.  Meets criteria for mild dementia.  Patient displayed deficits in memory (encoding and retention), executive functioning, processing speed, and attention. He otherwise performed within expectations.  A possible etiology is his history of stroke. However, it is unclear if his stroke fully explains his performance. An emerging neurodegenerative condition cannot be ruled out, specifically Alzheimer's disease.  Plan for LP for Alzheimer's biomarkers.   Follow-up in 6 months.   NeuroPsych testing 11/2023: MOCA 19/30.  STMS 20/38.  Patient meets criteria for mild dementia. His performance may reflect medial temporal lobe dysfunction and frontal/subcortical/parietal lobe dysfunction. He displayed some expressive language dysfunction which may have impacted his performance, though his language was largely intact on objective measures. A possible etiology is his history of stroke. However, it is unclear if his stroke fully explains his performance. An emerging neurodegenerative condition cannot be ruled out, specifically Alzheimer's disease, given his wife's report of progressive cognitive decline   Repeat Hypercoag labs 04/2023:  Cardiolipin Ab, Hex Lupus Ac, DRVV, Beta 2 GP IgM/IgG negative   Vitamin B12 12/2023:  301   12/2023: A1c 5.6,  LDL 81                 Objective: amLODIPine (NORVASC) 5 mg tablet Take one tablet by mouth daily.    ascorbic acid (VITAMIN C) 500 mg tablet Take one tablet by mouth at bedtime daily.    aspirin 81 mg chewable tablet Chew 1 tablet by mouth daily. Take with food.    rosuvastatin (CRESTOR) 5 mg tablet Take one tablet by mouth daily.    vitamins, multiple tablet Take one tablet by mouth at bedtime daily.     Vitals:    04/01/24 1058   BP: 127/83   Pulse: 72   PainSc: Zero   Weight: 103.9 kg (229 lb)   Height: 198.1 cm (6' 6)     Body mass index is 26.46 kg/m?Aaron Aas     Physical Exam  Mental status: alert,  oriented to place and time, but some difficulty due to aphasia   Speech:     Normal Abnormal   Fluency   Expressive aphasia   Comprehension   Some difficulty with following complex commands    Articulation x                          Cranial Nerves:  Some difficulties with R visual field, otherwise grossly intact.        Muscle/motor:   Tone: nml  Bulk: nml          NF NE SA EF EE WE WF FF FE FA TA HF HA HE KF KE DF PF In Ev TF TE   R     5 5 5 5 5 5       5     5 5 5 5            L     5 5 5 5 5 5       5     5 5 5 5                 Sensation:     Normal RUE LUE RLE LLE   Light Touch x           Pin Prick             Temperature             Vibration             Proprioception                  Coordination:     Normal Abnormal Right Abnormal Left   Finger to Nose x       Rapid alternating          Heel to Shin         Finger tap         Foot tap         Other           Gait and Station:  Regular gait: nml       Assessment and Plan:  Gregory Gray is a 53 y.o. 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     Impression:  From a stroke standpoint he is overall stable.  Repeat MRs stable findings with encephalomalacia and ex-vacuo dilation of the posterior L lateral ventricle.  No new findings.  Overall etiology of his stroke is unknown, and remains cryptogenic at this time.   Repeat event monitor was unremarkable.  Continue secondary stroke risk factor modifications as below with ASA, statin, HTN management.  Continue follow-up in Memory Clinic.  Continue B12 supplementation.  Continue CPAP and sleep hygiene.  Rest of care as below.        Plan:    LMCA stroke s/p tPA and EVT TICI 08/2016, etiology, cryptogenic  -Continue ASA 81mg  daily  -Prior TTE/TEE unremarkable.   EF of 65%, normal ventricular size and function, normal atrial size, no shunt or significant valvular abnormalities.    -Repeat event monitor negative for arrythmias    -HLD: LDL 80.  Goal LDL <70.  Continue crestor 5mg .    -A1c at goal 5.6, goal <7.0.    -HTN: BP today 121/83.  Goal BP <130/80.  Continue amlodipine 5mg  daily.    -Repeat hypercoag testing negative.       Anterior L extra-axial lesion:  L anterior and L temporal vein prominence on MRI.  No meningioma or aneurysm or significant malformation noted.      Mild dementia, B12 deficiency  -Follow-up in Memory Clinic  -Holding off on  for LP with Alzheimer's biomarker panel at this time   -B12 replacement    Headaches, Possible Migraine w/out aura  -Continue Magnesium Oxide 400mg  daily. Start Conezyme Q10 200mg  BID.   Does not wish to do other pharmacological meds at this time.   -Continue CPAP  -Lifestyle modifications discussed     Episodes of UE Cramping, stable  -Semiology of events with retained awareness is not consistent with epileptic seizures.  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  Follow-up with Sleep Medicine  -Continue Sleep Hygiene practices (turning TV off)           Follow up in Neurology Clinic in 1 year.     Jacqualine Mater, DO  Vascular Neurology

## 2024-05-15 ENCOUNTER — Encounter: Admit: 2024-05-15 | Discharge: 2024-05-15 | Payer: MEDICARE

## 2024-08-12 ENCOUNTER — Encounter: Admit: 2024-08-12 | Discharge: 2024-08-12 | Payer: MEDICARE

## 2024-08-12 ENCOUNTER — Ambulatory Visit: Admit: 2024-08-12 | Discharge: 2024-08-12 | Payer: MEDICARE

## 2024-08-16 ENCOUNTER — Encounter: Admit: 2024-08-16 | Discharge: 2024-08-16 | Payer: MEDICARE

## 2024-08-16 DIAGNOSIS — G4733 Obstructive sleep apnea (adult) (pediatric): Principal | ICD-10-CM

## 2024-08-19 ENCOUNTER — Encounter: Admit: 2024-08-19 | Discharge: 2024-08-19 | Payer: MEDICARE

## 2024-08-19 NOTE — Telephone Encounter
 Blima Lemme, MD to Me (Selected Message)      08/16/24  4:21 PM  Note     Sleep study shows mild degree of sleep apnea. Please update patient and if agreeable, plan to order auto CPAP at 5 to 15 cms H2O. Follow up with Sleep clinic for compliance.         [facility] sent to patient to ask which DME he would like the order sent to.

## 2024-08-21 ENCOUNTER — Encounter: Admit: 2024-08-21 | Discharge: 2024-08-21 | Payer: MEDICARE

## 2024-08-21 NOTE — Telephone Encounter
 Order for AutoPAP 5-15cm H2O placed per recommendations.   DME: [facility]  Will continue to follow up.

## 2024-11-14 ENCOUNTER — Encounter: Admit: 2024-11-14 | Discharge: 2024-11-14 | Payer: MEDICARE

## 2024-11-14 NOTE — Telephone Encounter [36]
 An encounter has been created for documentation only (often for preparation of an upcoming appointment or for follow up on orders/imaging or records received) and patient does not need contact RN and did not miss a phone call or appointment.     Pre-visit planning for upcoming appointment with Dr.Luthra    Here for Late f/u    Dx: OSA    Last office visit with Dr.Luthra: 08/18/23    Pertinent testing/communication since last office visit:   - 08/12/24 HST with AHI 9.8/5.6 ; plan to order new CPAP  - unable to order new CPAP d/t being over a year since he has been seen. Also will not be eligible for new CPAP until 06/01/25    Current Medications/Treatments:   - CPAP 10 cmH2O    DME: [facility]  Nurse to obtain download from AV

## 2024-11-15 ENCOUNTER — Encounter: Admit: 2024-11-15 | Discharge: 2024-11-15 | Payer: MEDICARE

## 2024-11-15 ENCOUNTER — Ambulatory Visit: Admit: 2024-11-15 | Discharge: 2024-11-16 | Payer: MEDICARE

## 2024-11-15 VITALS — BP 146/88 | HR 80 | Temp 98.30000°F | Resp 16 | Ht 78.0 in | Wt 230.0 lb

## 2024-11-15 DIAGNOSIS — G4733 Obstructive sleep apnea (adult) (pediatric): Principal | ICD-10-CM

## 2024-11-15 NOTE — Progress Notes [1]
 Date of Service: 11/15/2024    [place], [place]  [address]  [address]  [place],  [place] [address]    Dear Dr. Bette,            Gregory Gray is a very pleasant 53 y.o. male presenting to [facility] Sleep Medicine clinic for ongoing evaluation of Sleep Problem.      Subjective:  History of Present Illness  Gregory Gray is a 53 year old male with obstructive sleep apnea who presents for a follow-up regarding CPAP machine issues.    Obstructive sleep apnea symptoms and sleep study findings  - Diagnosed with obstructive sleep apnea, currently managed with CPAP therapy for over five years.  - Recent sleep study in September demonstrated nine to ten apnea events per hour.  - Nocturnal oxygen saturation averages 95%.  - Sleep study was performed while using CPAP, which was not intended, as the goal was to establish a baseline without CPAP usage.  - Experiences daytime sleepiness.    Cpap equipment issues  - Experiencing difficulties with current CPAP equipment.  - Medical equipment company, [facility], has billed insurance for supplies that have not been delivered.  - Seeking assistance in obtaining a new CPAP machine and supplies from a different provider.    Cpap mask-related headaches and discomfort  - Experiences headaches when wearing CPAP mask, particularly around the jaw area.  - Suspects mask or headgear may be causing pressure points leading to discomfort.  - Has not changed headgear since initial receipt of CPAP machine.    ESS 11/15/2024:       11/15/2024     8:55 AM 08/11/2024     8:26 PM 08/11/2023     1:19 PM   Epworth Sleepiness Scale   Sitting and reading 2  1  1     Watching TV 2  2  1     Sitting inactive in a public place (e.g. a theater or a meeting) 2  1  1     As a passenger in a car for an hour without a break 1  1  1     Lying down to rest in the afternoon when circumstances permit 3  3  3     Sitting and talking to someone 3  1  1     Sitting quietly after a lunch without alcohol 3  3  3     In a car, while stopped for a few minutes in traffic 1  1  0    Epworth Sleepiness Scale Score 17  13  11         Patient-reported             Pre-Visit planning:   Here for Late f/u     Dx: OSA     Last office visit with Dr.Jamaal Bernasconi: 08/18/23     Pertinent testing/communication since last office visit:   - 08/12/24 HST with AHI 9.8/5.6 ; plan to order new CPAP  - unable to order new CPAP d/t being over a year since he has been seen. Also will not be eligible for new CPAP until 06/01/25     Current Medications/Treatments:   - CPAP 10 cmH2O     DME: [facility]  Nurse to obtain download from AV         Past Medical History:    Altered mental status    Arthritis    Dementia (CMS-HCC)    Disorganized thinking    Generalized headaches  Hypertension    Intellectual disability    Memory loss    Obstructive sleep apnea    Seasonal allergic reaction    Seizures (CMS-HCC)    Sleep apnea    Smoker    Stroke (CMS-HCC)    Vision decreased     Problem List:  2025-04: B12 deficiency  2025-04: Other headache syndrome  2024-10: Mild dementia (CMS-HCC)  2024-10: Migraine without aura and without status migrainosus, not   intractable  2024-10: Cramp of muscle of left upper extremity  2024-10: Primary hypertension  2024-10: Mixed hyperlipidemia  2024-10: Brain lesion  2024-09: Obstructive sleep apnea  2017-11: Right homonymous hemianopsia  2017-09: 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 No pertinent surgical history.  Family History   Problem Relation Name Age of Onset    Hypertension Mother Beverley Cancer Paternal Grandfather Nicolette Bellman         colon cancer    Blood Clots Maternal Grandmother [name]         when admitted to hospital    Cancer Maternal Grandmother [name]         Cervical Cancer    Cancer Paternal Grandmother n/a      Social History[1]      Objective:          amLODIPine (NORVASC) 5 mg tablet Take one tablet by mouth daily.    ascorbic acid (VITAMIN C) 500 mg tablet Take one tablet by mouth at bedtime daily.    aspirin 81 mg chewable tablet Chew 1 tablet by mouth daily. Take with food.    rosuvastatin (CRESTOR) 5 mg tablet Take one tablet by mouth daily.    vitamins, multiple tablet Take one tablet by mouth at bedtime daily.     Vitals:    11/15/24 1447   BP: (!) 146/88   BP Source: Arm, Right Upper   Pulse: 80   Temp: 36.8 ?C (98.3 ?F)   Resp: 16   SpO2: 97%   TempSrc: Oral   Weight: 104.3 kg (230 lb)   Height: 198.1 cm (6' 6)     Body mass index is 26.58 kg/m?SABRA   Hemoglobin   Date/Time Value Ref Range Status   08/29/2016 07:54 AM 13.9 13.5 - 16.5 GM/DL Final     TSH   Date/Time Value Ref Range Status   09/06/2023 10:53 AM 0.46 0.35 - 5.00 MCU/ML Final     Creatinine   Date/Time Value Ref Range Status   08/29/2016 07:54 AM 0.91 0.4 - 1.24 MG/DL Final     Results  Diagnostic  Home sleep apnea test (08/2024): Apnea-hypopnea index 9-10 events per hour, mild obstructive sleep apnea, average nocturnal oxygen saturation 95%    Physical Exam  Constitutional:       Appearance: Normal appearance.   Eyes:      Conjunctiva/sclera: Conjunctivae normal.      Pupils: Pupils are equal, round, and reactive to light.   Pulmonary:      Effort: Pulmonary effort is normal. No respiratory distress.   Skin:     General:  Skin is warm and dry.   Neurological:      Mental Status: Alert and oriented to person, place, and time. Mental status is at baseline.   Psychiatric:         Mood and Affect: Mood normal.         Behavior: Behavior normal.          Assessment and Plan:  Problem List          SLEEP Obstructive sleep apnea    Overview   2011: AHI 81, hypoxemia <85% for 57 minutes  2017:splint night. On set PAP of 10 cm H2O                Assessment & Plan  Obstructive sleep apnea  Mild obstructive sleep apnea with 9-10 events per hour. Oxygen saturation averages 95% at night. Current CPAP settings are at 10 cm H2O. Recent sleep study conducted with CPAP use, potentially underestimating severity. Daytime sleepiness scores are high, indicating possible inadequate control of apnea.  - Ordered new CPAP machine with auto settings from 8 to 15 cm H2O.  - Connected with [facility] for new CPAP machine and supplies.  - Scheduled follow-up in 90 days to review CPAP data and assess symptoms.  - Will discuss potential daytime medications if sleepiness persists.    Headache related to CPAP mask  Headaches likely due to pressure points from current CPAP mask setup. Possible contribution from teeth grinding.  - Adjust CPAP mask and headgear to alleviate pressure points.  - Consider dental evaluation for night guard if teeth grinding is suspected.    Patient was also counseled on good sleep hygiene, non-supine sleep, avoidance of alcohol before bedtime, regular exercise as tolerated and weight loss to target body weight. Cautioned on the risks of operating motor vehicle or any heavy machinery when sleepy and advised against drowsy driving.   Ample opportunity was provided for questions and concerns. Patient is aware they can contact our office via phone or mychart for any further queries.        Thank you for the opportunity to participate in the care of this patient. We will continue to follow them in Sleep Medicine clinic. Return in about 3 months (around 02/13/2025) for with Sleep NPs: Cammie Benders, or Rolan.    Total Time Today was 42 minutes in the following activities: Preparing to see the patient, Obtaining and/or reviewing separately obtained history, Performing a medically appropriate examination and/or evaluation, Counseling and educating the patient/family/caregiver, Documenting clinical information in the electronic or other health record, and communicating results to the patient/family/caregiver and Care coordination.    Sincerely,  Wilhelmena Duck, MD  Sleep Medicine   Assistant Professor   [employer]    Orders Placed This Encounter    AMB REFERRAL TO HOME CARE    AMB REFERRAL TO HOME CARE            [1]   Social History  Socioeconomic History    Marital status: Married   Tobacco Use    Smoking status: Every Day     Average packs/day: 1 pack/day for 17.0 years (17.0 ttl pk-yrs)     Types: Cigarettes, Cigars     Start date: 2000     Last attempt to quit: 08/18/2023     Years since quitting: 1.2     Passive exposure: Past (Wife)    Smokeless tobacco: Never    Tobacco comments:  Quit after stroke,  started again in 2022   Substance and Sexual Activity    Alcohol use: Not Currently     Comment: Maybe once or twice a year    Drug use: Never    Sexual activity: Yes     Partners: Female     Birth control/protection: None

## 2024-11-20 ENCOUNTER — Encounter: Admit: 2024-11-20 | Discharge: 2024-11-20 | Payer: MEDICARE

## 2024-11-20 NOTE — Telephone Encounter [36]
 DME: [facility]. Phone: [phone] Fax: [phone]     Faxed pressure change order to [facility].  Fax successful.     DME: [facility] [place], [place]. Phone: [phone] Fax: [phone]  [facility]- Wadie Griffon)  NEW DME:  [facility] [employer]    Faxed Demographics, order, sleep study  and OVN 11/15/2024 & 08/18/2023    Fax successful.    Reminders sent.

## 2024-12-08 ENCOUNTER — Encounter: Admit: 2024-12-08 | Discharge: 2024-12-08 | Payer: MEDICARE

## 2024-12-11 NOTE — Progress Notes [1]
 MEMORY CARE CLINIC EVALUATION    Chief Complaint:  Gregory Gray is a 54 y.o. old male here for a follow-up cognitive evaluation.   Onset:  2022  Course: static    This patient is accompanied in the office by his wife, Gregory Gray who contributes to the history.   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 Struggles to read and write after his stroke.     Last visit with myself on 11/23/2023. Since last visit, his memory has been about the same. Follows with pulmonology and utilizes CPAP. They both deny any memory concerns today.     Recent changes in health or hospitalizations? No    Function  Living situation: Lives with wife  Eating: Independent  Toileting: Independent  Bathing: Independent  Dressing: Independent  Meal Preparation: Independent, can make a simple meal. Wife is the main cook  Usual household chores: Independent, field seismologist  Medication administration: Dependent, wife sets them up in a pillbox  Managing finances: Dependent, wife took over after his stroke. He would struggle to calculate a tip or make change  Activity: Gardens, woodworking, is getting into the Building Control Surveyor and Neuropsychiatric History (i.e., depression, agitation, behavior / personality):   PHQ-2 Score: 0 (12/13/2024 10:07 AM)    Mood: No current concerns  Agitation/Behavior/Personality changes: Occasional agitation and frustration, manageable at this time  Visual hallucinations: No      ROS:  Parkinsonism / tremors: No  Gait Changes: R leg weakness from previous stroke, stumbles occasionally, more stooped posture   Falls: No recent falls  Vision changes: No  Hearing changes: No  Speech changes: expressive aphasia   Unintentional Wt loss: No  Swallowing difficulties: No  Sleep issues: Difficulty with sleep initiation or maintenance. Prefers to keep tv on when sleeping. Has sleep apnea, occasionally wears CPAP. No known RBD. Tells me his sleep varies  B/B Incontinence: No    Past Medical History:    Altered mental status    Arthritis    Dementia (CMS-HCC)    Disorganized thinking    Generalized headaches    Hypertension    Intellectual disability    Memory loss    Obstructive sleep apnea    Seasonal allergic reaction    Seizures (CMS-HCC)    Sleep apnea    Smoker    Stroke (CMS-HCC)    Vision decreased       Family History   Problem Relation Name Age of Onset    Hypertension Mother Beverley     Cancer Paternal Grandfather Bud Wagner         colon cancer    Blood Clots Maternal Grandmother [name]         when admitted to hospital    Cancer Maternal Grandmother [name]         Cervical Cancer    Cancer Paternal Grandmother n/a        Social History     Socioeconomic History    Marital status: Married   Tobacco Use    Smoking status: Every Day     Average packs/day: 1 pack/day for 17.0 years (17.0 ttl pk-yrs)     Types: Cigarettes, Cigars     Start date: 2000     Last attempt to quit: 08/18/2023     Years since quitting: 1.3     Passive exposure: Past (Wife)    Smokeless  tobacco: Never    Tobacco comments:     Quit after stroke,  started again in 2022   Substance and Sexual Activity    Alcohol use: Not Currently     Comment: Maybe once or twice a year    Drug use: Never    Sexual activity: Yes     Partners: Female     Birth control/protection: None       Care Partner Burden and Social Support:  KARLIN HEILMAN does partially rely on care giving.  Care partner burden expressed? No    Safety Assessment:  Driving? Yes, staying more in familiar areas and routine     Disorientation when driving? No     Recent traffic violations or accidents? No     Passenger concerns? No  Safety concerns in home? No   Wandering? No    Advanced Care Planning  DPOA:  Yes   Advance Directive: Yes    Medications:   amLODIPine (NORVASC) 5 mg tablet Take one tablet by mouth daily.    ascorbic acid (VITAMIN C) 500 mg tablet Take one tablet by mouth at bedtime daily.    aspirin 81 mg chewable tablet Chew 1 tablet by mouth daily. Take with food.    rosuvastatin (CRESTOR) 5 mg tablet Take one tablet by mouth daily.    vitamins, multiple tablet Take one tablet by mouth at bedtime daily.       Medication Review:   I have personally reviewed the medication list and am making recommendations as listed in the plan below.   Jayron and his wife manages medication at home for [name].   The following medications have been identified as potential high risk medications: none    Labs/Imaging:  TSH   Date Value Ref Range Status   09/06/2023 0.46 0.35 - 5.00 MCU/ML Final     01/23/2023 CT Head  Unchanged prior L parietal MCA infarct with ex vacuo dilation of the posterior horn of L lateral ventricle. No abnormal enhancement or ICH        04/23/2023 MRI Head  IMPRESSION     1. Chronic left MCA distribution infarct/s   2. No acute/recent infarct   3. Mild chronic microvascular ischemic changes.   4. Small ovoid 1 cm extra-axial enhancing lesion at the anterior left   middle cranial fossa. The leading diagnostic consideration is a small   meningioma. Potential other possibilities include a varix or less likely   an atypical aneurysm. If clinically indicated, CTA or MRA is recommended   for further evaluation           Physical Exam  Vital Signs: BP (!) 147/82  - Pulse 73  - Ht 198.1 cm (6' 6)  - Wt 103.8 kg (228 lb 12.8 oz)  - BMI 26.44 kg/m?     BP Readings from Last 3 Encounters:   12/13/24 (!) 147/82   11/15/24 (!) 146/88   04/01/24 127/83     Wt Readings from Last 6 Encounters:   12/13/24 103.8 kg (228 lb 12.8 oz)   11/15/24 104.3 kg (230 lb)   04/01/24 103.9 kg (229 lb)   11/23/23 102.6 kg (226 lb 3.2 oz)   09/06/23 98.4 kg (217 lb)   05/22/23 99.8 kg (220 lb)       General  Groomed with appropriate hygiene. Dress appropriate for season/situation    Mental Status  Alert, scored 6/8 for orientation on STMS  Speech is fluent with intact comprehension,  naming and repetition.    Cardio/Pulm  Non labored at rest, good effort    Cranial Nerves  Extraocular movements intact  Pupils equal and reactive to light  No facial asymmetry  Shoulder shrug symmetrical  Tongue midline  All other CNs normal    Motor  Full strength in the upper and lower extremities  Rigidity: No rigidity  Bradykinesia: Absent  Rest tremor: Not present    Coordination  Normal  Finger Taps WNL  Toe Taps WNL    Gait  Rising from chair: Normal - without pushing off  Posture: Normal  Gait initiation: Normal  Stride Length: Normal  Arm Swing: Normal  Turns: Normal    Psych  Affect and mood congruent and appropriate to situation  Thought process is logical    Neurobehavioral Testing:  02/2023 [facility] 19/30  05/2023 STMS 20/38  11/2023 STMS 24/38    Short Test of Mental Status  Orientation: 7/8  -unable to state address, he tells me he has never memorized this  Attention: 4/7  Immediate Recall: 4/4 (number of trials: 2,- 1)  Calculations: 0/4  -did not want to calculate  Abstraction: 3/3  Cube: 2/2  Clock: 1/2  Information: 3/4  Delayed Recall: 3/4    Total Score: 26/38       Neuropsychology Summary and Impression (refer to Neuropsychologist progress note for complete documentation of tests performed and results):  11/2016 Neuropsych Evaluation Dr. Jose  Impressions:  This neuropsychological evaluation revealed that the patient continues to have expressive and receptive aphasia that are impacting his performance on most auditory tasks.  For example, his estimated overall intellectual functioning was in the borderline range on the WASI-II and, in comparison, is average on a non-verbal intelligence test (TONI-4).  In addition, he had significant difficulties on tasks of vocabulary, verbal fluency, and naming, while his vocabulary on a picture-vocabulary test that allowed him to point to the picture best fitting a word was average and above the 12th grade equivalent.  His aphasia also impacted his performance on verbal memory tasks and a verbal reasoning task (all within the impaired range).  However, his visual-spatial abilities and visual reasoning were average.  His immediate simple visual memory was average but delayed was mildly impaired.  Similarly his immediate complex visual memory was low average but delayed was mildly impaired.  On the latter task, his performance improved to within the average range with cuing.  His performance on visual motor speed tasks was impaired likely in part because of difficulties with letters and numbers ([place]), although his visual motor speed was also slowed on a fine motor dexterity task (severely impaired bilaterally).  His gross motor strength was low average bilaterally.  His basic sensory perceptual functioning was intact with compensation noted in his right visual field on tasks.  He endorsed minimal depressed mood and anxiety symptoms on screens.      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 Ancel  Summary and Impression: Patient displayed deficits in memory (encoding and retention), executive functioning, processing speed, and attention. He otherwise performed within expectations.      Patient meets criteria for mild dementia. His performance may reflect medial temporal lobe dysfunction and frontal/subcortical/parietal lobe dysfunction. He displayed some expressive language dysfunction which may have impacted his performance, though his language was largely intact on objective measures. A possible etiology is his  history of stroke. However, it is unclear if his stroke fully explains his performance. An emerging neurodegenerative condition cannot be ruled out, specifically Alzheimer's disease, given his wife's report of progressive cognitive decline.    Diagnosis and Plan  1. Chronic ischemic left MCA stroke            FAST score: 4. Mild Dementia-IADL's become affected, such as bill paying, cooking, cleaning, traveling    Decision Making  Patient does not appear to have remarkable difficulties with their medical decision-making capacity.      CARE PLAN  Further Evaluation  No further testing at this time. Continue staying active.     Cognitive therapy plan  No medications indicated at this time  Risks and benefits of sleep apnea discussed. Continue wearing your CPAP.     Neuropsychiatric therapy  No medications needed at this time    Lifestyle Recommendations  Encouraged to stay active mentally, physically, and socially (reading, exercising, visiting with friends and family)  Encouraged heart healthy diet  Driving: Continue with current driving limitations.     My Alliance: [email or web address]    Referrals  Cognitive Care Network:  Deferred today, has adequate support   Research:  Previously discussed and declined  General educational materials provided on lifestyle interventions for memory care    Follow-up  Return to clinic in 1 year      Other important information:  Scheduling: [phone]   Harlene Gist or our nurses: [phone]      How to Keep Your Brain Healthy   Physical exercise: Physical exercise allows more blood flow to get to your brain.  All exercise is better than none, but gardening and walking are like tier 1 of exercise and will not get your heart rate higher than 100 in many instances. Weight training, yoga, and resistance bands are like tier 2 level. We see the real benefits at tier 3 level of exercise, which involves aerobic or cardiovascular exercise. Cardio exercise involves getting your heart rate up and a real sweat going.  This level of exercise can release BDNF (brain derived neurotrophic factor) which helps stabilize neurons - we think of it as fertilizer for those brain cells.  Research studies suggest symptoms progress slower and brain atrophy slows in cardio intensive exercise groups.  This can be done with high intensity workouts, running, biking, stationary biking, rowing machine, or in a swimming pool.  Talk to your health care provider before doing strenuous exercise. The ultimate goal is to get 150 minutes of moderate to intense exercise each week, most easily divided up into 30 minutes per day 5 days per week. It is recommended that one eases into this and slowly increases their amount of exercise over weeks to months. If you have never exercised before or are in poor exercise shape, it is recommended that you seek a personal trainer or join a local gym with programs directed for your age/exercise level.     Diet: Eating a heart-healthy diet may help protect brain function. Further information about the Mediterranean/MIND diet is listed below.  Talk to your primary medical provider about your specific diet needs.     Sleep: The toxic proteins that we all build up during the day and are associated with neurodegeneration of neurons are likely only cleared during deep, slow wave stages of sleep. If deep sleep is disrupted, fewer proteins are cleared. Sleep is also important for memory consolidation and restorative aspects to help with attention of tasks throughout the next  day.  The average person requires 7-9 hours of quality sleep per night.  Try to maintain a sleep routine.  Avoid caffeine in the afternoon and heavy meals after 7 pm.  Talk to your local medical provider if you are having problems with sleeping (for example: insomnia, obstructive sleep apnea, excessive daytime sleepiness, or not sleeping enough).     Mental exercise: Push your brain with new things and new places.  Take a community education class, or attend a lecture at [employer]. Doing activities, such as reading, learning an instrument or a new language is healthy for our brains. Utilize cognitive training apps or programs. There is no evidence currently that one works better than other, but make sure not to focus on one thing (crosswords, Sudoku puzzles, computer-based games, etc.)  Make sure to have a broad cognitive program.     Stress Reduction: Simplify your life as much as possible. Meditate, do yoga or tai chi, or perform deep breathing, etc.  Stress will amplify any underlying problem.       Spiritual Fitness: Find something you are passionate about and that gives your life meaning and purpose each day.  Get involved.  Stay active in your local community and with social functions.     Getting involved in research: This can be empowering for many who are involved in research. It gives individuals a sense of purpose and fighting back against this disease. The first person who will be cured of a neurodegenerative disease will be from a research study and helping us  move that science forward is a major contribution.     Mediterranean Diet   The Mediterranean Diet and the MIND diet are the best studied across neurodegenerative diseases.  Both have shown a reduced risk of Alzheimer's disease across multiple studies, ranging from 35-50% reduced risk.  The Mediterranean diet is considered to be a heart-healthy diet as well.  Interested in trying the Mediterranean diet? These tips will help you get started:  Eat more fruits and vegetables. Aim for 7 to 10 servings a day of fruit and vegetables.  Opt for whole grains. Switch to whole-grain bread, cereal and pasta. Experiment with other whole grains, such as bulgur and farro.  Use healthy fats. Try olive oil as a replacement for butter when cooking. Instead of putting butter or margarine on bread, try dipping it in flavored olive oil.  Eat more seafood. Eat fish twice a week. Fresh or water-packed tuna, salmon, trout, mackerel and herring are healthy choices. Grilled fish tastes good and requires little cleanup. Avoid deep-fried fish.  Reduce red meat. Substitute fish, poultry or beans for meat. If you eat meat, make sure it's lean and keep portions small.  Enjoy some dairy. Eat low-fat Greek or plain yogurt and small amounts of a variety of cheeses.  Spice it up. Herbs and spices boost flavor and lessen the need for salt.     Utilize a high rated book on the subject or a good solicitor as well: https://www.helpguide.org/articles/diets/the-mediterranean-diet.htm    Total of 40 minutes were spent on the same day of the visit including preparing to see the patient, obtaining and/or reviewing separately obtained history, performing a medically appropriate examination and/or evaluation, counseling and educating the patient/family/caregiver, ordering medications, tests, or procedures, referring and communication with other health care professionals, documenting clinical information in the electronic or other health record, independently interpreting results and communicating results to the patient/family/caregiver, and care coordination.

## 2024-12-13 ENCOUNTER — Ambulatory Visit: Admit: 2024-12-13 | Discharge: 2024-12-13 | Payer: MEDICARE

## 2024-12-13 ENCOUNTER — Encounter: Admit: 2024-12-13 | Discharge: 2024-12-13 | Payer: MEDICARE

## 2024-12-13 VITALS — BP 147/82 | HR 73 | Ht 78.0 in | Wt 228.8 lb

## 2024-12-13 DIAGNOSIS — Z8673 Personal history of transient ischemic attack (TIA), and cerebral infarction without residual deficits: Principal | ICD-10-CM

## 2024-12-13 NOTE — Patient Instructions [37]
 CARE PLAN  Further Evaluation  No further testing at this time. Continue staying active.     Cognitive therapy plan  No medications indicated at this time  Risks and benefits of sleep apnea discussed. Continue wearing your CPAP.     Neuropsychiatric therapy  No medications needed at this time    Lifestyle Recommendations  Encouraged to stay active mentally, physically, and socially (reading, exercising, visiting with friends and family)  Encouraged heart healthy diet  Driving: Continue with current driving limitations.     My Alliance: [email or web address]    Referrals  Cognitive Care Network:  Deferred today, has adequate support   Research:  Previously discussed and declined  General educational materials provided on lifestyle interventions for memory care    Follow-up  Return to clinic in 1 year      Other important information:  Scheduling: [phone]   Harlene Gist or our nurses: [phone]      How to Keep Your Brain Healthy   Physical exercise: Physical exercise allows more blood flow to get to your brain.  All exercise is better than none, but gardening and walking are like tier 1 of exercise and will not get your heart rate higher than 100 in many instances. Weight training, yoga, and resistance bands are like tier 2 level. We see the real benefits at tier 3 level of exercise, which involves aerobic or cardiovascular exercise. Cardio exercise involves getting your heart rate up and a real sweat going.  This level of exercise can release BDNF (brain derived neurotrophic factor) which helps stabilize neurons - we think of it as fertilizer for those brain cells.  Research studies suggest symptoms progress slower and brain atrophy slows in cardio intensive exercise groups.  This can be done with high intensity workouts, running, biking, stationary biking, rowing machine, or in a swimming pool.  Talk to your health care provider before doing strenuous exercise. The ultimate goal is to get 150 minutes of moderate to intense exercise each week, most easily divided up into 30 minutes per day 5 days per week. It is recommended that one eases into this and slowly increases their amount of exercise over weeks to months. If you have never exercised before or are in poor exercise shape, it is recommended that you seek a personal trainer or join a local gym with programs directed for your age/exercise level.     Diet: Eating a heart-healthy diet may help protect brain function. Further information about the Mediterranean/MIND diet is listed below.  Talk to your primary medical provider about your specific diet needs.     Sleep: The toxic proteins that we all build up during the day and are associated with neurodegeneration of neurons are likely only cleared during deep, slow wave stages of sleep. If deep sleep is disrupted, fewer proteins are cleared. Sleep is also important for memory consolidation and restorative aspects to help with attention of tasks throughout the next day.  The average person requires 7-9 hours of quality sleep per night.  Try to maintain a sleep routine.  Avoid caffeine in the afternoon and heavy meals after 7 pm.  Talk to your local medical provider if you are having problems with sleeping (for example: insomnia, obstructive sleep apnea, excessive daytime sleepiness, or not sleeping enough).     Mental exercise: Push your brain with new things and new places.  Take a community education class, or attend a lecture at [employer]. Doing activities, such as reading, learning an instrument  or a new language is healthy for our brains. Utilize cognitive training apps or programs. There is no evidence currently that one works better than other, but make sure not to focus on one thing (crosswords, Sudoku puzzles, computer-based games, etc.)  Make sure to have a broad cognitive program.     Stress Reduction: Simplify your life as much as possible. Meditate, do yoga or tai chi, or perform deep breathing, etc.  Stress will amplify any underlying problem.       Spiritual Fitness: Find something you are passionate about and that gives your life meaning and purpose each day.  Get involved.  Stay active in your local community and with social functions.     Getting involved in research: This can be empowering for many who are involved in research. It gives individuals a sense of purpose and fighting back against this disease. The first person who will be cured of a neurodegenerative disease will be from a research study and helping us  move that science forward is a major contribution.     Mediterranean Diet   The Mediterranean Diet and the MIND diet are the best studied across neurodegenerative diseases.  Both have shown a reduced risk of Alzheimer's disease across multiple studies, ranging from 35-50% reduced risk.  The Mediterranean diet is considered to be a heart-healthy diet as well.  Interested in trying the Mediterranean diet? These tips will help you get started:  Eat more fruits and vegetables. Aim for 7 to 10 servings a day of fruit and vegetables.  Opt for whole grains. Switch to whole-grain bread, cereal and pasta. Experiment with other whole grains, such as bulgur and farro.  Use healthy fats. Try olive oil as a replacement for butter when cooking. Instead of putting butter or margarine on bread, try dipping it in flavored olive oil.  Eat more seafood. Eat fish twice a week. Fresh or water-packed tuna, salmon, trout, mackerel and herring are healthy choices. Grilled fish tastes good and requires little cleanup. Avoid deep-fried fish.  Reduce red meat. Substitute fish, poultry or beans for meat. If you eat meat, make sure it's lean and keep portions small.  Enjoy some dairy. Eat low-fat Greek or plain yogurt and small amounts of a variety of cheeses.  Spice it up. Herbs and spices boost flavor and lessen the need for salt.     Utilize a high rated book on the subject or a good solicitor as well: https://www.helpguide.org/articles/diets/the-mediterranean-diet.htm

## 2025-01-01 ENCOUNTER — Encounter: Admit: 2025-01-01 | Discharge: 2025-01-01 | Payer: MEDICARE
